# Patient Record
Sex: Male | Born: 1995 | Race: White | Hispanic: No | Marital: Single | Smoking: Current some day smoker
Health system: Southern US, Community
[De-identification: ages and names within clinical notes are randomized; demographics above are authoritative.]

## PROBLEM LIST (undated history)

## (undated) DIAGNOSIS — Z720 Tobacco use: Secondary | ICD-10-CM

## (undated) DIAGNOSIS — F1990 Other psychoactive substance use, unspecified, uncomplicated: Secondary | ICD-10-CM

## (undated) DIAGNOSIS — F909 Attention-deficit hyperactivity disorder, unspecified type: Secondary | ICD-10-CM

## (undated) DIAGNOSIS — F913 Oppositional defiant disorder: Secondary | ICD-10-CM

## (undated) DIAGNOSIS — L0291 Cutaneous abscess, unspecified: Secondary | ICD-10-CM

## (undated) DIAGNOSIS — J45909 Unspecified asthma, uncomplicated: Secondary | ICD-10-CM

## (undated) DIAGNOSIS — F1911 Other psychoactive substance abuse, in remission: Secondary | ICD-10-CM

## (undated) HISTORY — DX: Cutaneous abscess, unspecified: L02.91

## (undated) HISTORY — DX: Other psychoactive substance abuse, in remission: F19.11

## (undated) HISTORY — DX: Unspecified asthma, uncomplicated: J45.909

## (undated) HISTORY — DX: Oppositional defiant disorder: F91.3

## (undated) HISTORY — DX: Tobacco use: Z72.0

## (undated) HISTORY — PX: ABCESS DRAINAGE: SHX399

## (undated) HISTORY — DX: Other psychoactive substance use, unspecified, uncomplicated: F19.90

## (undated) HISTORY — DX: Attention-deficit hyperactivity disorder, unspecified type: F90.9

---
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
## 2015-01-08 LAB — WOUND CULTURE

## 2015-01-18 ENCOUNTER — Ambulatory Visit (INDEPENDENT_AMBULATORY_CARE_PROVIDER_SITE_OTHER): Payer: Medicaid Other | Admitting: Family Medicine

## 2015-01-18 ENCOUNTER — Encounter: Payer: Self-pay | Admitting: Family Medicine

## 2015-01-18 ENCOUNTER — Other Ambulatory Visit: Payer: Self-pay | Admitting: Family Medicine

## 2015-01-18 VITALS — BP 120/74 | HR 78 | Temp 98.8°F | Resp 16 | Wt 143.0 lb

## 2015-01-18 DIAGNOSIS — J208 Acute bronchitis due to other specified organisms: Secondary | ICD-10-CM

## 2015-01-18 MED ORDER — DOXYCYCLINE HYCLATE 100 MG PO CAPS: 100.0000 mg | ORAL_CAPSULE | Freq: Two times a day (BID) | ORAL | Status: DC

## 2015-01-18 NOTE — Progress Notes (Signed)
Subjective:    Patient ID: Frank Duran, male    DOB: 1995/03/01, 19 y.o.   MRN: [id number]  HPI Patient is a 19 year old smoker who presents with 4 days of worsening chest congestion, cough productive of green sputum, wheezing. He has been using albuterol at home as needed for cough and wheezing although his symptoms are getting worse. He denies any hemoptysis. He does report some pleurisy he is no longer receiving Adderall from this office. After his recent admission, he is now seeing a psychiatrist. Past Medical History  Diagnosis Date  . ADHD (attention deficit hyperactivity disorder)   . ODD (oppositional defiant disorder)   . History of substance abuse   . Tobacco abuse 09/16/2012  . IV drug user   . Abscess     Left Elbow ( requiring I & D)   Past Surgical History  Procedure Laterality Date  . Incision and drainage abscess Left 10/18/2014    Procedure: INCISION AND DRAINAGE ABSCESS;  Surgeon: Hubbard Robinson, MD;  Location: [facility] ORS;  Service: General;  Laterality: Left;   Current Outpatient Prescriptions on File Prior to Visit  Medication Sig Dispense Refill  . traZODone (DESYREL) 100 MG tablet Take 1 tablet (100 mg total) by mouth at bedtime and may repeat dose one time if needed. 30 tablet 0   No current facility-administered medications on file prior to visit.   Allergies  Allergen Reactions  . Penicillins Other (See Comments)    Has patient had a PCN reaction causing immediate rash, facial/tongue/throat swelling, SOB or lightheadedness with hypotension: Yes Has patient had a PCN reaction causing severe rash involving mucus membranes or skin necrosis: Yes Has patient had a PCN reaction that required hospitalization Yes- ED visit Has patient had a PCN reaction occurring within the last 10 years: No If all of the above answers are "NO", then may proceed with Cephalosporin use.    Social History   Social History  . Marital Status: Single    Spouse Name:  N/A  . Number of Children: N/A  . Years of Education: N/A   Occupational History  . Not on file.   Social History Main Topics  . Smoking status: Current Some Day Smoker -- 0.50 packs/day for 5 years    Types: Cigarettes  . Smokeless tobacco: Current User  . Alcohol Use: 0.0 oz/week    0 Standard drinks or equivalent per week     Comment: have in the past  . Drug Use: Yes    Special: Marijuana, Hydrocodone, Benzodiazepines     Comment: herion  . Sexual Activity: Not on file     Comment: Lives with Dad, Returning to high school to graduate.  Works on cars.   Other Topics Concern  . Not on file   Social History Narrative      Review of Systems  All other systems reviewed and are negative.      Objective:   Physical Exam  HENT:  Right Ear: External ear normal.  Left Ear: External ear normal.  Nose: Nose normal.  Mouth/Throat: Oropharynx is clear and moist. No oropharyngeal exudate.  Eyes: Conjunctivae are normal.  Neck: Neck supple.  Cardiovascular: Normal rate, regular rhythm and normal heart sounds.   Pulmonary/Chest: Effort normal. He has wheezes. He has rales.  Abdominal: Soft. Bowel sounds are normal.  Lymphadenopathy:    He has no cervical adenopathy.  Vitals reviewed.         Assessment & Plan:  Acute bronchitis  due to other specified organisms - Plan: doxycycline (VIBRAMYCIN) 100 MG capsule  Patient has an asthmatic bronchitis. I recommended albuterol 2 puffs inhaled every 6 hours as needed. Recommended smoking cessation. Use doxycycline 100 mg by mouth twice a day for 10 days. Consider prednisone if symptoms worsen.

## 2015-02-03 ENCOUNTER — Other Ambulatory Visit: Payer: Self-pay | Admitting: Family Medicine

## 2015-06-08 ENCOUNTER — Other Ambulatory Visit: Payer: Self-pay | Admitting: Family Medicine

## 2015-06-08 NOTE — Telephone Encounter (Signed)
Refill appropriate and filled per protocol. 

## 2015-07-06 ENCOUNTER — Other Ambulatory Visit: Payer: Self-pay | Admitting: Family Medicine

## 2015-07-26 ENCOUNTER — Other Ambulatory Visit: Payer: Self-pay | Admitting: Family Medicine

## 2015-08-23 ENCOUNTER — Encounter (HOSPITAL_COMMUNITY): Payer: Self-pay | Admitting: Emergency Medicine

## 2015-08-23 ENCOUNTER — Encounter (HOSPITAL_COMMUNITY): Payer: Self-pay | Admitting: *Deleted

## 2015-08-23 ENCOUNTER — Emergency Department (HOSPITAL_COMMUNITY)
Admission: EM | Admit: 2015-08-23 | Discharge: 2015-08-23 | Disposition: A | Discharge status: Home / Self Care | Payer: Medicaid Other | Attending: Dermatology | Admitting: Dermatology

## 2015-08-23 ENCOUNTER — Emergency Department
Admission: EM | Admit: 2015-08-23 | Discharge: 2015-08-23 | Disposition: A | Discharge status: Home / Self Care | Payer: Medicaid Other | Attending: Emergency Medicine | Admitting: Emergency Medicine

## 2015-08-23 DIAGNOSIS — R52 Pain, unspecified: Secondary | ICD-10-CM | POA: Diagnosis not present

## 2015-08-23 DIAGNOSIS — F1123 Opioid dependence with withdrawal: Secondary | ICD-10-CM | POA: Insufficient documentation

## 2015-08-23 DIAGNOSIS — R45851 Suicidal ideations: Secondary | ICD-10-CM | POA: Insufficient documentation

## 2015-08-23 DIAGNOSIS — Z5321 Procedure and treatment not carried out due to patient leaving prior to being seen by health care provider: Secondary | ICD-10-CM | POA: Insufficient documentation

## 2015-08-23 DIAGNOSIS — F909 Attention-deficit hyperactivity disorder, unspecified type: Secondary | ICD-10-CM | POA: Insufficient documentation

## 2015-08-23 DIAGNOSIS — F17210 Nicotine dependence, cigarettes, uncomplicated: Secondary | ICD-10-CM | POA: Insufficient documentation

## 2015-08-23 DIAGNOSIS — F1120 Opioid dependence, uncomplicated: Secondary | ICD-10-CM | POA: Diagnosis present

## 2015-08-23 LAB — CBC
HCT: 44.5 % (ref 39.0–52.0)
HEMATOCRIT: 41.4 % (ref 39.0–52.0)
HEMOGLOBIN: 14.3 g/dL (ref 13.0–17.0)
Hemoglobin: 15.5 g/dL (ref 13.0–17.0)
MCH: 30.4 pg (ref 26.0–34.0)
MCH: 30.1 pg (ref 26.0–34.0)
MCHC: 34.8 g/dL (ref 30.0–36.0)
MCHC: 34.5 g/dL (ref 30.0–36.0)
MCV: 87.3 fL (ref 78.0–100.0)
MCV: 87.2 fL (ref 78.0–100.0)
PLATELETS: 282 10*3/uL (ref 150–400)
Platelets: 283 10*3/uL (ref 150–400)
RBC: 5.1 MIL/uL (ref 4.22–5.81)
RBC: 4.75 MIL/uL (ref 4.22–5.81)
RDW: 12.3 % (ref 11.5–15.5)
RDW: 12.6 % (ref 11.5–15.5)
WBC: 5 10*3/uL (ref 4.0–10.5)
WBC: 7.3 10*3/uL (ref 4.0–10.5)

## 2015-08-23 LAB — COMPREHENSIVE METABOLIC PANEL
ALBUMIN: 4.4 g/dL (ref 3.5–5.0)
ALT: 21 U/L (ref 17–63)
ALT: 20 U/L (ref 17–63)
ANION GAP: 8 (ref 5–15)
ANION GAP: 7 (ref 5–15)
AST: 25 U/L (ref 15–41)
AST: 20 U/L (ref 15–41)
Albumin: 4.3 g/dL (ref 3.5–5.0)
Alkaline Phosphatase: 91 U/L (ref 38–126)
Alkaline Phosphatase: 83 U/L (ref 38–126)
BILIRUBIN TOTAL: 1.1 mg/dL (ref 0.3–1.2)
BILIRUBIN TOTAL: 0.4 mg/dL (ref 0.3–1.2)
BUN: 9 mg/dL (ref 6–20)
BUN: 10 mg/dL (ref 6–20)
CO2: 28 mmol/L (ref 22–32)
CO2: 29 mmol/L (ref 22–32)
Calcium: 9.8 mg/dL (ref 8.9–10.3)
Calcium: 9.5 mg/dL (ref 8.9–10.3)
Chloride: 101 mmol/L (ref 101–111)
Chloride: 103 mmol/L (ref 101–111)
Creatinine, Ser: 0.98 mg/dL (ref 0.61–1.24)
Creatinine, Ser: 0.88 mg/dL (ref 0.61–1.24)
GFR calc Af Amer: >60 mL/min (ref >60)
GFR calc Af Amer: >60 mL/min (ref >60)
GFR calc non Af Amer: >60 mL/min (ref >60)
GLUCOSE: 93 mg/dL (ref 65–99)
Glucose, Bld: 127 mg/dL — ABNORMAL HIGH (ref 65–99)
POTASSIUM: 4.2 mmol/L (ref 3.5–5.1)
POTASSIUM: 3.9 mmol/L (ref 3.5–5.1)
Sodium: 137 mmol/L (ref 135–145)
Sodium: 139 mmol/L (ref 135–145)
TOTAL PROTEIN: 7.5 g/dL (ref 6.5–8.1)
TOTAL PROTEIN: 7.2 g/dL (ref 6.5–8.1)

## 2015-08-23 LAB — LIPASE, BLOOD: Lipase: 15 U/L (ref 11–51)

## 2015-08-23 LAB — ETHANOL

## 2015-08-23 LAB — SALICYLATE LEVEL: Salicylate Lvl: <4 mg/dL (ref 2.8–30.0)

## 2015-08-23 LAB — ACETAMINOPHEN LEVEL
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
## 2015-08-23 NOTE — ED Triage Notes (Signed)
Pt reports suicidal as well

## 2015-08-23 NOTE — ED Notes (Signed)
Pt here with girlfriend who is also pt and sts tired of waiting and wants to leave; pt ambulated out of department and EDP aware

## 2015-08-24 ENCOUNTER — Emergency Department (HOSPITAL_COMMUNITY)
Admission: EM | Admit: 2015-08-24 | Discharge: 2015-08-24 | Disposition: A | Discharge status: Home / Self Care | Payer: Medicaid Other | Attending: Emergency Medicine | Admitting: Emergency Medicine

## 2015-08-24 ENCOUNTER — Ambulatory Visit (HOSPITAL_COMMUNITY): Admission: RE | Admit: 2015-08-24 | Payer: Medicaid Other | Source: Home / Self Care | Admitting: Psychiatry

## 2015-08-24 DIAGNOSIS — F1123 Opioid dependence with withdrawal: Secondary | ICD-10-CM

## 2015-08-24 DIAGNOSIS — F1193 Opioid use, unspecified with withdrawal: Secondary | ICD-10-CM

## 2015-08-24 MED ORDER — ONDANSETRON HCL 4 MG PO TABS: 4.0000 mg | ORAL_TABLET | Freq: Four times a day (QID) | ORAL | 0 refills | Status: DC

## 2015-08-24 NOTE — ED Notes (Signed)
Pt called,no answer.
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
## 2015-08-24 NOTE — ED Notes (Signed)
PA at bedside.

## 2015-08-24 NOTE — ED Notes (Signed)
Pt called from lobby, no answer. 

## 2015-09-27 ENCOUNTER — Other Ambulatory Visit: Payer: Self-pay | Admitting: Family Medicine

## 2015-10-11 ENCOUNTER — Ambulatory Visit: Payer: Self-pay | Admitting: Family Medicine

## 2015-10-27 ENCOUNTER — Other Ambulatory Visit: Payer: Self-pay | Admitting: Family Medicine

## 2015-11-27 ENCOUNTER — Emergency Department (HOSPITAL_COMMUNITY)
Admission: EM | Admit: 2015-11-27 | Discharge: 2015-11-27 | Discharge status: Against Medical Advice | Payer: Medicaid Other | Attending: Emergency Medicine | Admitting: Emergency Medicine

## 2015-11-27 ENCOUNTER — Emergency Department (HOSPITAL_COMMUNITY): Payer: Medicaid Other

## 2015-11-27 ENCOUNTER — Encounter (HOSPITAL_COMMUNITY): Payer: Self-pay | Admitting: *Deleted

## 2015-11-27 DIAGNOSIS — Y999 Unspecified external cause status: Secondary | ICD-10-CM | POA: Insufficient documentation

## 2015-11-27 DIAGNOSIS — S01511A Laceration without foreign body of lip, initial encounter: Secondary | ICD-10-CM | POA: Diagnosis not present

## 2015-11-27 DIAGNOSIS — M25571 Pain in right ankle and joints of right foot: Secondary | ICD-10-CM | POA: Diagnosis not present

## 2015-11-27 DIAGNOSIS — F17210 Nicotine dependence, cigarettes, uncomplicated: Secondary | ICD-10-CM | POA: Diagnosis not present

## 2015-11-27 DIAGNOSIS — S0993XA Unspecified injury of face, initial encounter: Secondary | ICD-10-CM | POA: Diagnosis present

## 2015-11-27 DIAGNOSIS — Y939 Activity, unspecified: Secondary | ICD-10-CM | POA: Insufficient documentation

## 2015-11-27 DIAGNOSIS — M545 Low back pain, unspecified: Secondary | ICD-10-CM

## 2015-11-27 DIAGNOSIS — F909 Attention-deficit hyperactivity disorder, unspecified type: Secondary | ICD-10-CM | POA: Insufficient documentation

## 2015-11-27 DIAGNOSIS — R51 Headache: Secondary | ICD-10-CM | POA: Diagnosis not present

## 2015-11-27 DIAGNOSIS — Y92410 Unspecified street and highway as the place of occurrence of the external cause: Secondary | ICD-10-CM | POA: Diagnosis not present

## 2015-11-27 MED ORDER — LIDOCAINE-EPINEPHRINE 1 %-1:100000 IJ SOLN
20.0000 mL | Freq: Once | INTRAMUSCULAR | Status: DC
  Filled 2015-11-27: qty 1

## 2015-11-27 MED ORDER — LIDOCAINE-EPINEPHRINE (PF) 1 %-1:200000 IJ SOLN
20.0000 mL | Freq: Once | INTRAMUSCULAR | Status: DC
  Filled 2015-11-27: qty 20

## 2015-11-27 NOTE — ED Notes (Signed)
Patient refused all vitals and being put on monitor.

## 2015-11-27 NOTE — ED Notes (Signed)
Pt. Refused to sign his AMA signature. Pt. Also refused vitals , BP and temp and Pulse ox.  Pt. Jumped out of bed and dressed and left.  Security showed him to the exit.

## 2015-11-27 NOTE — ED Notes (Signed)
Pt. Is very drowsy,  He wakes up to answer questions and goes back to sleep.
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
## 2015-11-27 NOTE — ED Notes (Signed)
Patient transported to CT 

## 2015-11-29 ENCOUNTER — Emergency Department
Admission: EM | Admit: 2015-11-29 | Discharge: 2015-11-29 | Disposition: A | Discharge status: Home / Self Care | Payer: Medicaid Other | Attending: Emergency Medicine | Admitting: Emergency Medicine

## 2015-11-29 DIAGNOSIS — S01511A Laceration without foreign body of lip, initial encounter: Secondary | ICD-10-CM

## 2015-11-29 DIAGNOSIS — M25512 Pain in left shoulder: Secondary | ICD-10-CM | POA: Diagnosis not present

## 2015-11-29 DIAGNOSIS — M25562 Pain in left knee: Secondary | ICD-10-CM | POA: Insufficient documentation

## 2015-11-29 DIAGNOSIS — M25552 Pain in left hip: Secondary | ICD-10-CM | POA: Insufficient documentation

## 2015-11-29 DIAGNOSIS — M79602 Pain in left arm: Secondary | ICD-10-CM | POA: Diagnosis not present

## 2015-11-29 DIAGNOSIS — M25551 Pain in right hip: Secondary | ICD-10-CM | POA: Diagnosis not present

## 2015-11-29 DIAGNOSIS — M79601 Pain in right arm: Secondary | ICD-10-CM | POA: Diagnosis not present

## 2015-11-29 DIAGNOSIS — M25561 Pain in right knee: Secondary | ICD-10-CM | POA: Insufficient documentation

## 2015-11-29 DIAGNOSIS — M542 Cervicalgia: Secondary | ICD-10-CM | POA: Insufficient documentation

## 2015-11-29 DIAGNOSIS — F909 Attention-deficit hyperactivity disorder, unspecified type: Secondary | ICD-10-CM | POA: Diagnosis not present

## 2015-11-29 DIAGNOSIS — F17210 Nicotine dependence, cigarettes, uncomplicated: Secondary | ICD-10-CM | POA: Diagnosis not present

## 2015-11-29 DIAGNOSIS — M545 Low back pain: Secondary | ICD-10-CM | POA: Insufficient documentation

## 2015-11-29 DIAGNOSIS — S0993XA Unspecified injury of face, initial encounter: Secondary | ICD-10-CM | POA: Diagnosis present

## 2015-11-29 DIAGNOSIS — M25572 Pain in left ankle and joints of left foot: Secondary | ICD-10-CM | POA: Insufficient documentation

## 2015-11-29 DIAGNOSIS — Y92410 Unspecified street and highway as the place of occurrence of the external cause: Secondary | ICD-10-CM | POA: Insufficient documentation

## 2015-11-29 DIAGNOSIS — Y939 Activity, unspecified: Secondary | ICD-10-CM | POA: Insufficient documentation

## 2015-11-29 DIAGNOSIS — Z7289 Other problems related to lifestyle: Secondary | ICD-10-CM | POA: Insufficient documentation

## 2015-11-29 DIAGNOSIS — M25571 Pain in right ankle and joints of right foot: Secondary | ICD-10-CM | POA: Insufficient documentation

## 2015-11-29 DIAGNOSIS — M25511 Pain in right shoulder: Secondary | ICD-10-CM | POA: Insufficient documentation

## 2015-11-29 DIAGNOSIS — Y999 Unspecified external cause status: Secondary | ICD-10-CM | POA: Insufficient documentation

## 2015-11-29 DIAGNOSIS — R52 Pain, unspecified: Secondary | ICD-10-CM

## 2015-11-29 DIAGNOSIS — Z765 Malingerer [conscious simulation]: Secondary | ICD-10-CM
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
## 2015-12-15 ENCOUNTER — Other Ambulatory Visit: Payer: Self-pay | Admitting: Family Medicine

## 2016-02-01 ENCOUNTER — Other Ambulatory Visit: Payer: Self-pay | Admitting: Family Medicine

## 2016-02-09 ENCOUNTER — Other Ambulatory Visit: Payer: Self-pay | Admitting: Family Medicine

## 2016-05-02 ENCOUNTER — Other Ambulatory Visit: Payer: Self-pay | Admitting: Family Medicine

## 2016-07-05 ENCOUNTER — Other Ambulatory Visit: Payer: Self-pay | Admitting: Family Medicine

## 2016-07-07 ENCOUNTER — Emergency Department (HOSPITAL_COMMUNITY): Payer: Medicaid Other

## 2016-07-07 ENCOUNTER — Encounter (HOSPITAL_COMMUNITY): Payer: Self-pay | Admitting: Emergency Medicine

## 2016-07-07 ENCOUNTER — Emergency Department (HOSPITAL_COMMUNITY)
Admission: EM | Admit: 2016-07-07 | Discharge: 2016-07-07 | Disposition: A | Discharge status: Home / Self Care | Payer: Medicaid Other | Attending: Emergency Medicine | Admitting: Emergency Medicine

## 2016-07-07 DIAGNOSIS — Y9389 Activity, other specified: Secondary | ICD-10-CM | POA: Diagnosis not present

## 2016-07-07 DIAGNOSIS — S0990XA Unspecified injury of head, initial encounter: Secondary | ICD-10-CM | POA: Insufficient documentation

## 2016-07-07 DIAGNOSIS — W2211XA Striking against or struck by driver side automobile airbag, initial encounter: Secondary | ICD-10-CM | POA: Insufficient documentation

## 2016-07-07 DIAGNOSIS — Y999 Unspecified external cause status: Secondary | ICD-10-CM | POA: Diagnosis not present

## 2016-07-07 DIAGNOSIS — S8002XA Contusion of left knee, initial encounter: Secondary | ICD-10-CM | POA: Diagnosis not present

## 2016-07-07 DIAGNOSIS — S3992XA Unspecified injury of lower back, initial encounter: Secondary | ICD-10-CM | POA: Diagnosis not present

## 2016-07-07 DIAGNOSIS — F17210 Nicotine dependence, cigarettes, uncomplicated: Secondary | ICD-10-CM | POA: Diagnosis not present

## 2016-07-07 DIAGNOSIS — Y92410 Unspecified street and highway as the place of occurrence of the external cause: Secondary | ICD-10-CM | POA: Diagnosis not present

## 2016-07-07 DIAGNOSIS — S199XXA Unspecified injury of neck, initial encounter: Secondary | ICD-10-CM | POA: Diagnosis present

## 2016-07-07 DIAGNOSIS — S299XXA Unspecified injury of thorax, initial encounter: Secondary | ICD-10-CM | POA: Diagnosis not present

## 2016-07-07 DIAGNOSIS — M542 Cervicalgia: Secondary | ICD-10-CM

## 2016-07-07 LAB — COMPREHENSIVE METABOLIC PANEL
ALBUMIN: 3.8 g/dL (ref 3.5–5.0)
ALT: 15 U/L — ABNORMAL LOW (ref 17–63)
AST: 22 U/L (ref 15–41)
Alkaline Phosphatase: 95 U/L (ref 38–126)
Anion gap: 10 (ref 5–15)
BUN: 8 mg/dL (ref 6–20)
CHLORIDE: 102 mmol/L (ref 101–111)
CO2: 24 mmol/L (ref 22–32)
CREATININE: 0.92 mg/dL (ref 0.61–1.24)
Calcium: 9.1 mg/dL (ref 8.9–10.3)
GFR calc Af Amer: >60 mL/min (ref >60)
GFR calc non Af Amer: >60 mL/min (ref >60)
GLUCOSE: 98 mg/dL (ref 65–99)
POTASSIUM: 3.6 mmol/L (ref 3.5–5.1)
SODIUM: 136 mmol/L (ref 135–145)
Total Bilirubin: 1 mg/dL (ref 0.3–1.2)
Total Protein: 6.7 g/dL (ref 6.5–8.1)

## 2016-07-07 LAB — CBC
HCT: 40.2 % (ref 39.0–52.0)
Hemoglobin: 14 g/dL (ref 13.0–17.0)
MCH: 30.1 pg (ref 26.0–34.0)
MCHC: 34.8 g/dL (ref 30.0–36.0)
MCV: 86.5 fL (ref 78.0–100.0)
PLATELETS: 225 10*3/uL (ref 150–400)
RBC: 4.65 MIL/uL (ref 4.22–5.81)
RDW: 12.5 % (ref 11.5–15.5)
WBC: 9.4 10*3/uL (ref 4.0–10.5)

## 2016-07-07 MED ORDER — SODIUM CHLORIDE 0.9 % IV BOLUS (SEPSIS)
1000.0000 mL | Freq: Once | INTRAVENOUS | Status: AC
  Administered 2016-07-07: 1000 mL via INTRAVENOUS

## 2016-07-07 MED ORDER — IOPAMIDOL (ISOVUE-300) INJECTION 61%
INTRAVENOUS | Status: AC
Start: 2016-07-07 — End: 2016-07-07
  Administered 2016-07-07: 100 mL
  Filled 2016-07-07: qty 100

## 2016-07-07 MED ORDER — KETOROLAC TROMETHAMINE 30 MG/ML IJ SOLN
30.0000 mg | Freq: Once | INTRAMUSCULAR | Status: AC
  Administered 2016-07-07: 30 mg via INTRAVENOUS
  Filled 2016-07-07: qty 1
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
## 2016-07-07 NOTE — Discharge Instructions (Signed)
Use ibuprofen 800 mg every six hours for three days. Drink plenty of fluids Walk and perform gentle movements regularly.
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
## 2016-08-24 ENCOUNTER — Other Ambulatory Visit: Payer: Self-pay | Admitting: Family Medicine

## 2016-08-25 ENCOUNTER — Other Ambulatory Visit: Payer: Self-pay | Admitting: Family Medicine

## 2016-08-28 ENCOUNTER — Other Ambulatory Visit: Payer: Self-pay | Admitting: Family Medicine

## 2016-10-23 ENCOUNTER — Encounter: Payer: Self-pay | Admitting: Family Medicine

## 2016-10-23 ENCOUNTER — Ambulatory Visit (INDEPENDENT_AMBULATORY_CARE_PROVIDER_SITE_OTHER): Payer: Medicaid Other | Admitting: Family Medicine

## 2016-10-23 VITALS — BP 90/60 | HR 66 | Temp 98.7°F | Resp 12 | Wt 126.0 lb

## 2016-10-23 DIAGNOSIS — R0781 Pleurodynia: Secondary | ICD-10-CM

## 2016-10-23 NOTE — Progress Notes (Signed)
Subjective:    Patient ID: Frank Duran, male    DOB: 26-Feb-1995, 21 y.o.   MRN: [id number]  HPI  Patient is now been seen since 2016. In the interval time, he has suffered from polysubstance abuse. Today he presents with pain in the anterior portion of his chest. He reports swelling and erythema over his lower left ribs near the xiphoid process. However today there is no visible swelling. There is no erythema. There is no warmth. He is tender to palpation when I touch the rib margin just adjacent to the xiphoid process. He states that it has been hurting ever since he coughed extremely hard when he became accidentally choked on serial. He denies any trauma to the area. On exam today, his lungs are clear to auscultation bilaterally. His heart demonstrates regular rate and rhythm with no murmurs rubs or gallops. Past Medical History:  Diagnosis Date  . Abscess    Left Elbow ( requiring I & D)  . ADHD (attention deficit hyperactivity disorder)   . History of substance abuse   . IV drug user   . ODD (oppositional defiant disorder)   . Tobacco abuse 09/16/2012   Past Surgical History:  Procedure Laterality Date  . INCISION AND DRAINAGE ABSCESS Left 10/18/2014   Procedure: INCISION AND DRAINAGE ABSCESS;  Surgeon: Hubbard Robinson, MD;  Location: [facility] ORS;  Service: General;  Laterality: Left;   Current Outpatient Prescriptions on File Prior to Visit  Medication Sig Dispense Refill  . cetirizine (ZYRTEC) 10 MG tablet TAKE 1 TABLET BY MOUTH ONCE A DAY 30 tablet 3  . ibuprofen (ADVIL,MOTRIN) 800 MG tablet TAKE 1 TABLET BY MOUTH EVERY 8 HOURS AS NEEDED FOR PAIN 90 tablet 0  . PROAIR HFA 108 (90 BASE) MCG/ACT inhaler USE 2 INHALATIONS EVERY 4 HOURS AS NEEDED (Patient taking differently: USE 2 INHALATIONS EVERY 4 HOURS AS NEEDED FOR SHORTNESS OF BREATH/WHEEZING) 8.5 g 1  . traZODone (DESYREL) 100 MG tablet Take 1 tablet (100 mg total) by mouth at bedtime and may repeat dose one time if  needed. 30 tablet 0   No current facility-administered medications on file prior to visit.    Allergies  Allergen Reactions  . Penicillins Other (See Comments)    Has patient had a PCN reaction causing immediate rash, facial/tongue/throat swelling, SOB or lightheadedness with hypotension: Yes Has patient had a PCN reaction causing severe rash involving mucus membranes or skin necrosis: Yes Has patient had a PCN reaction that required hospitalization Yes- ED visit Has patient had a PCN reaction occurring within the last 10 years: No If all of the above answers are "NO", then may proceed with Cephalosporin use.    Social History   Social History  . Marital status: Single    Spouse name: N/A  . Number of children: N/A  . Years of education: N/A   Occupational History  . Not on file.   Social History Main Topics  . Smoking status: Current Some Day Smoker    Packs/day: 0.50    Years: 5.00    Types: Cigarettes  . Smokeless tobacco: Current User  . Alcohol use 0.0 oz/week     Comment: have in the past  . Drug use: Yes    Types: Marijuana, Hydrocodone, Benzodiazepines     Comment: herion  . Sexual activity: Not on file     Comment: Lives with Dad, Returning to high school to graduate.  Works on cars.   Other Topics Concern  .  Not on file   Social History Narrative  . No narrative on file     Review of Systems  All other systems reviewed and are negative.      Objective:   Physical Exam  Cardiovascular: Normal rate and normal heart sounds.  Exam reveals no gallop and no friction rub.   No murmur heard. Pulmonary/Chest: Effort normal and breath sounds normal. No respiratory distress. He has no wheezes. He has no rales. He exhibits bony tenderness. He exhibits no tenderness.    Vitals reviewed.         Assessment & Plan:  Rib pain - Plan: DG Chest 2 View  Given the fact that the swelling has subsided after 1 week and today his exam is completely normal, I  anticipate that the patient may have bruised his ribs. I will begin by obtaining a basic chest x-ray. There is no evidence of a pneumothorax. Cardiovascular exam is completely normal. There is no subcutaneous mass such as an abscess or cyst that I can appreciate. Therefore chest x-ray was normal and in fact symptoms are improving, I will recommend clinical monitoring only.

## 2016-10-26 ENCOUNTER — Emergency Department: Payer: Medicaid Other

## 2016-10-26 ENCOUNTER — Emergency Department
Admission: EM | Admit: 2016-10-26 | Discharge: 2016-10-26 | Disposition: A | Discharge status: Home / Self Care | Payer: Medicaid Other | Attending: Emergency Medicine | Admitting: Emergency Medicine

## 2016-10-26 ENCOUNTER — Encounter: Payer: Self-pay | Admitting: Physician Assistant

## 2016-10-26 DIAGNOSIS — R079 Chest pain, unspecified: Secondary | ICD-10-CM | POA: Diagnosis present

## 2016-10-26 DIAGNOSIS — Z79899 Other long term (current) drug therapy: Secondary | ICD-10-CM | POA: Insufficient documentation

## 2016-10-26 DIAGNOSIS — M940 Chondrocostal junction syndrome [Tietze]: Secondary | ICD-10-CM | POA: Insufficient documentation

## 2016-10-26 DIAGNOSIS — R0789 Other chest pain: Secondary | ICD-10-CM | POA: Insufficient documentation

## 2016-10-26 DIAGNOSIS — F17228 Nicotine dependence, chewing tobacco, with other nicotine-induced disorders: Secondary | ICD-10-CM | POA: Insufficient documentation

## 2016-10-26 DIAGNOSIS — F17210 Nicotine dependence, cigarettes, uncomplicated: Secondary | ICD-10-CM | POA: Insufficient documentation

## 2016-10-26 MED ORDER — CYCLOBENZAPRINE HCL 10 MG PO TABS
5.0000 mg | ORAL_TABLET | Freq: Once | ORAL | Status: AC
Start: 2016-10-26 — End: 2016-10-26
  Administered 2016-10-26: 5 mg via ORAL
  Filled 2016-10-26: qty 1

## 2016-10-26 MED ORDER — CYCLOBENZAPRINE HCL 5 MG PO TABS: 5.0000 mg | ORAL_TABLET | Freq: Three times a day (TID) | ORAL | 0 refills | Status: DC | PRN

## 2016-10-26 MED ORDER — NAPROXEN 500 MG PO TABS: 500.0000 mg | ORAL_TABLET | Freq: Two times a day (BID) | ORAL | 0 refills | Status: AC

## 2016-10-26 MED ORDER — IBUPROFEN 600 MG PO TABS
600.0000 mg | ORAL_TABLET | Freq: Once | ORAL | Status: AC
Start: 2016-10-26 — End: 2016-10-26
  Administered 2016-10-26: 600 mg via ORAL
  Filled 2016-10-26: qty 1

## 2016-10-26 NOTE — Discharge Instructions (Signed)
Your exam and chest x-ray are essentially normal at this time. There is no serious infection seen on your x-ray. Take the prescription meds as directed. Apply ice or moist heat to the area to reduce swelling.

## 2016-10-26 NOTE — ED Provider Notes (Signed)
[facility] Emergency Department Provider Note ____________________________________________  Time seen: 2221  I have reviewed the triage vital signs and the nursing notes.  HISTORY  Chief Complaint  Chest Pain  HPI Frank Duran is a 21 y.o. male Presents to the ED for evaluation of a "knot" to the left chest wall. Patient describes is been there for several weeks. He reports it as being tender to touch is over the lower left rib cage border. He was evaluated by his primary care provider, and told to report to the ED for chest x-ray to rule out a rib fracture.the patient denies any known injury, accident, or chest wall trauma. He denies any shortness of breath or chest wall pain, but does note increased pain with deep breaths, to the lateral rib cage. He reports the area just initial touch, and notes over the last several weeks he's had episodes of swelling and increased tenderness to the area. Denies any fevers, chills, respiratory infections, or other exposures.  Past Medical History:  Diagnosis Date  . Abscess    Left Elbow ( requiring I & D)  . ADHD (attention deficit hyperactivity disorder)   . History of substance abuse   . IV drug user   . ODD (oppositional defiant disorder)   . Tobacco abuse 09/16/2012    Patient Active Problem List   Diagnosis Date Noted  . Cellulitis and abscess 10/18/2014  . Cellulitis of left upper extremity   . Abscess 10/16/2014  . Polysubstance dependence including opioid drug with daily use ([place]) 10/13/2014  . Tobacco abuse 09/16/2012  . ODD (oppositional defiant disorder)   . History of substance abuse   . Back pain 08/06/2012  . ADHD (attention deficit hyperactivity disorder)     Past Surgical History:  Procedure Laterality Date  . INCISION AND DRAINAGE ABSCESS Left 10/18/2014   Procedure: INCISION AND DRAINAGE ABSCESS;  Surgeon: Hubbard Robinson, MD;  Location: [facility] ORS;  Service: General;  Laterality: Left;     Prior to Admission medications   Medication Sig Start Date End Date Taking? Authorizing Provider  cetirizine (ZYRTEC) 10 MG tablet TAKE 1 TABLET BY MOUTH ONCE A DAY 08/25/16   Susy Frizzle, MD  cyclobenzaprine (FLEXERIL) 5 MG tablet Take 1 tablet (5 mg total) by mouth 3 (three) times daily as needed for muscle spasms. 10/26/16   Amiley Shishido, Dannielle Karvonen, PA-C  ibuprofen (ADVIL,MOTRIN) 800 MG tablet TAKE 1 TABLET BY MOUTH EVERY 8 HOURS AS NEEDED FOR PAIN 08/28/16   Susy Frizzle, MD  naproxen (NAPROSYN) 500 MG tablet Take 1 tablet (500 mg total) by mouth 2 (two) times daily with a meal. 10/26/16 11/25/16  Karina Lenderman, Dannielle Karvonen, PA-C  PROAIR HFA 108 (90 BASE) MCG/ACT inhaler USE 2 INHALATIONS EVERY 4 HOURS AS NEEDED Patient taking differently: USE 2 INHALATIONS EVERY 4 HOURS AS NEEDED FOR SHORTNESS OF BREATH/WHEEZING 01/18/15   Susy Frizzle, MD  traZODone (DESYREL) 100 MG tablet Take 1 tablet (100 mg total) by mouth at bedtime and may repeat dose one time if needed. 10/15/14   Kerrie Buffalo, NP    Allergies Penicillins  Family History  Problem Relation Age of Onset  . COPD Mother   . Hyperlipidemia Father   . Cancer Paternal Grandmother        Breast  . Heart disease Paternal Grandmother   . Hypertension Paternal Grandfather   . Heart disease Paternal Grandfather   . Cancer Paternal Grandfather  Prostate    Social History Social History  Substance Use Topics  . Smoking status: Current Some Day Smoker    Packs/day: 0.50    Years: 5.00    Types: Cigarettes  . Smokeless tobacco: Current User  . Alcohol use 0.0 oz/week     Comment: have in the past    Review of Systems  Constitutional: Negative for fever. Eyes: Negative for visual changes. ENT: Negative for sore throat. Cardiovascular: Negative for chest pain. Respiratory: Negative for shortness of breath. Gastrointestinal: Negative for abdominal pain, vomiting and diarrhea. Genitourinary: Negative for  dysuria. Musculoskeletal: Negative for back pain. Left chest wall pain Skin: Negative for rash. Neurological: Negative for headaches, focal weakness or numbness. ____________________________________________  PHYSICAL EXAM:  VITAL SIGNS: ED Triage Vitals  Enc Vitals Group     BP 10/26/16 2117 110/64     Pulse Rate 10/26/16 2117 79     Resp 10/26/16 2117 12     Temp 10/26/16 2117 98.2 F (36.8 C)     Temp Source 10/26/16 2117 Oral     SpO2 10/26/16 2117 99 %     Weight 10/26/16 2122 126 lb (57.2 kg)     Height 10/26/16 2122 5\' 7"  (1.702 m)     Head Circumference --      Peak Flow --      Pain Score 10/26/16 2123 4     Pain Loc --      Pain Edu? --      Excl. in [place]? --     Constitutional: Alert and oriented. Well appearing and in no distress. Patient is sleeping in the treatment bed with a his girlfriend, upon entering the room.  Head: Normocephalic and atraumatic. Cardiovascular: Normal rate, regular rhythm. Normal distal pulses. Respiratory: Normal respiratory effort. No wheezes/rales/rhonchi. Gastrointestinal: Soft and nontender. No distention. Musculoskeletal: patient's chest wall without any obvious deformity or acute injury. He is tender to palpation over the left anterior inferior rib border. There is no crepitus with palpation, flail chest, or other acute chest deformity. Pectus excavatum is appreciated the distal sternum.Nontender with normal range of motion in all extremities.  Neurologic:  Normal gait without ataxia. Normal speech and language. No gross focal neurologic deficits are appreciated. Skin:  Skin is warm, dry and intact. No rash noted. ____________________________________________   RADIOLOGY CXR  IMPRESSION: No active cardiopulmonary disease.  I, Ritik Stavola, Dannielle Karvonen, personally viewed and evaluated these images (plain radiographs) as part of my medical decision making, as well as reviewing the written report by the  radiologist. ____________________________________________  PROCEDURES  Flexeril 5 mg PO Naproxen 500 mg PO ____________________________________________  INITIAL IMPRESSION / ASSESSMENT AND PLAN / ED COURSE  Patient with the ED evaluation of palpable left chest wall pain and tenderness without recent or known injury, accident, or trauma. His exam is overall benign and chest x-ray is negative for any acute fracture, dislocation, or cardiac pulmonary process. His symptoms likely represent a mild chest contusion or early costochondritis. He is discharged with a prescription for cyclobenzaprine and naproxen to dose as directed. He is advised to apply ice to the area to help reduce symptoms. He should follow-up with primary care provider for ongoing symptom management. Return precautions are reviewed. ____________________________________________  FINAL CLINICAL IMPRESSION(S) / ED DIAGNOSES  Final diagnoses:  Chest wall pain  Costochondritis      Carmie End, Dannielle Karvonen, PA-C 10/26/16 2333    Harvest Dark, MD 10/29/16 [phone]

## 2016-10-26 NOTE — ED Triage Notes (Addendum)
Patient ambulatory to triage with steady gait, without difficulty or distress noted; pt reports "knot" to chest for several weeks, tender to touch to left lower ribcage; st was told by PCP to come to ED for CXR to r/o rib fx

## 2016-10-26 NOTE — ED Notes (Signed)

## 2016-11-23 ENCOUNTER — Other Ambulatory Visit: Payer: Self-pay | Admitting: Family Medicine

## 2016-11-27 ENCOUNTER — Ambulatory Visit: Payer: Medicaid Other | Admitting: Family Medicine

## 2016-11-28 ENCOUNTER — Ambulatory Visit: Payer: Medicaid Other | Admitting: Family Medicine

## 2016-12-26 ENCOUNTER — Other Ambulatory Visit: Payer: Self-pay | Admitting: Family Medicine

## 2017-04-05 ENCOUNTER — Ambulatory Visit: Payer: Medicaid Other | Admitting: Family Medicine

## 2017-05-16 ENCOUNTER — Other Ambulatory Visit: Payer: Self-pay | Admitting: Family Medicine

## 2017-06-13 ENCOUNTER — Other Ambulatory Visit: Payer: Self-pay | Admitting: Family Medicine

## 2017-07-06 ENCOUNTER — Other Ambulatory Visit: Payer: Self-pay | Admitting: Family Medicine

## 2017-07-24 ENCOUNTER — Ambulatory Visit (INDEPENDENT_AMBULATORY_CARE_PROVIDER_SITE_OTHER): Payer: Medicaid Other | Admitting: Family Medicine

## 2017-07-24 ENCOUNTER — Encounter: Payer: Self-pay | Admitting: Family Medicine

## 2017-07-24 VITALS — BP 144/88 | HR 110 | Temp 98.3°F | Resp 14 | Ht 68.0 in | Wt 152.0 lb

## 2017-07-24 DIAGNOSIS — Z87898 Personal history of other specified conditions: Secondary | ICD-10-CM

## 2017-07-24 DIAGNOSIS — Z720 Tobacco use: Secondary | ICD-10-CM | POA: Diagnosis not present

## 2017-07-24 DIAGNOSIS — F909 Attention-deficit hyperactivity disorder, unspecified type: Secondary | ICD-10-CM | POA: Diagnosis not present

## 2017-07-24 DIAGNOSIS — J302 Other seasonal allergic rhinitis: Secondary | ICD-10-CM

## 2017-07-24 DIAGNOSIS — M549 Dorsalgia, unspecified: Secondary | ICD-10-CM | POA: Diagnosis not present

## 2017-07-24 DIAGNOSIS — G4700 Insomnia, unspecified: Secondary | ICD-10-CM | POA: Diagnosis not present

## 2017-07-24 DIAGNOSIS — F1911 Other psychoactive substance abuse, in remission: Secondary | ICD-10-CM

## 2017-07-24 DIAGNOSIS — J45909 Unspecified asthma, uncomplicated: Secondary | ICD-10-CM | POA: Diagnosis not present

## 2017-07-24 DIAGNOSIS — G8929 Other chronic pain: Secondary | ICD-10-CM

## 2017-07-24 DIAGNOSIS — Z0000 Encounter for general adult medical examination without abnormal findings: Secondary | ICD-10-CM

## 2017-07-24 DIAGNOSIS — L989 Disorder of the skin and subcutaneous tissue, unspecified: Secondary | ICD-10-CM

## 2017-07-24 LAB — COMPLETE METABOLIC PANEL WITHOUT GFR
AG Ratio: 1.6 (calc) (ref 1.0–2.5)
ALT: 12 U/L (ref 9–46)
AST: 17 U/L (ref 10–40)
Albumin: 4.5 g/dL (ref 3.6–5.1)
Alkaline phosphatase (APISO): 92 U/L (ref 40–115)
BUN: 10 mg/dL (ref 7–25)
CO2: 28 mmol/L (ref 20–32)
Calcium: 10 mg/dL (ref 8.6–10.3)
Chloride: 104 mmol/L (ref 98–110)
Creat: 0.83 mg/dL (ref 0.60–1.35)
GFR, Est African American: 145 mL/min/{1.73_m2}
GFR, Est Non African American: 125 mL/min/{1.73_m2}
Globulin: 2.8 g/dL (ref 1.9–3.7)
Glucose, Bld: 91 mg/dL (ref 65–99)
Potassium: 4.3 mmol/L (ref 3.5–5.3)
Sodium: 141 mmol/L (ref 135–146)
Total Bilirubin: 0.6 mg/dL (ref 0.2–1.2)
Total Protein: 7.3 g/dL (ref 6.1–8.1)

## 2017-07-24 LAB — CBC WITH DIFFERENTIAL/PLATELET
BASOS ABS: 18 {cells}/uL (ref 0–200)
Basophils Relative: 0.3 %
EOS ABS: 43 {cells}/uL (ref 15–500)
Eosinophils Relative: 0.7 %
HEMATOCRIT: 43.1 % (ref 38.5–50.0)
Hemoglobin: 14.8 g/dL (ref 13.2–17.1)
LYMPHS ABS: 1751 {cells}/uL (ref 850–3900)
MCH: 31.1 pg (ref 27.0–33.0)
MCHC: 34.3 g/dL (ref 32.0–36.0)
MCV: 90.5 fL (ref 80.0–100.0)
MPV: 10.6 fL (ref 7.5–12.5)
Monocytes Relative: 7.1 %
NEUTROS PCT: 63.2 %
Neutro Abs: 3855 cells/uL (ref 1500–7800)
Platelets: 292 10*3/uL (ref 140–400)
RBC: 4.76 10*6/uL (ref 4.20–5.80)
RDW: 13.1 % (ref 11.0–15.0)
Total Lymphocyte: 28.7 %
WBC: 6.1 10*3/uL (ref 3.8–10.8)
WBCMIX: 433 {cells}/uL (ref 200–950)

## 2017-07-24 LAB — URINALYSIS, ROUTINE W REFLEX MICROSCOPIC
Bilirubin Urine: NEGATIVE
Glucose, UA: NEGATIVE
HGB URINE DIPSTICK: NEGATIVE
Ketones, ur: NEGATIVE
LEUKOCYTES UA: NEGATIVE
NITRITE: NEGATIVE
Protein, ur: NEGATIVE
Specific Gravity, Urine: 1.025 (ref 1.001–1.03)
pH: 6 (ref 5.0–8.0)
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
## 2017-07-24 MED ORDER — TRAZODONE HCL 50 MG PO TABS: 50.0000 mg | ORAL_TABLET | Freq: Every evening | ORAL | 3 refills | Status: DC | PRN

## 2017-07-24 MED ORDER — ALBUTEROL SULFATE HFA 108 (90 BASE) MCG/ACT IN AERS: INHALATION_SPRAY | RESPIRATORY_TRACT | 1 refills | Status: DC

## 2017-07-24 MED ORDER — CETIRIZINE HCL 10 MG PO TABS: 10.0000 mg | ORAL_TABLET | Freq: Every day | ORAL | 11 refills | Status: DC

## 2017-07-24 NOTE — Patient Instructions (Addendum)
Get ahold of outpatient psychiatry - get into the outpatient intensive program - or at least outpatient management  See the asthma hand out - if you are using your inhaler several times a week we may need to increase your controller medications.  Use the trazadone at night and come follow up in 2-4 weeks if you are not getting into a good pattern of sleep.  See sleep hygiene handout -  Get busy - get another job, Psychologist, occupational.  Engage yourself in activities and physical exercise to help you be healthy, physically and mentally, and it should help your sleep.    Insomnia Insomnia is a sleep disorder that makes it difficult to fall asleep or to stay asleep. Insomnia can cause tiredness (fatigue), low energy, difficulty concentrating, mood swings, and poor performance at work or school. There are three different ways to classify insomnia:  Difficulty falling asleep.  Difficulty staying asleep.  Waking up too early in the morning.  Any type of insomnia can be long-term (chronic) or short-term (acute). Both are common. Short-term insomnia usually lasts for three months or less. Chronic insomnia occurs at least three times a week for longer than three months. What are the causes? Insomnia may be caused by another condition, situation, or substance, such as:  Anxiety.  Certain medicines.  Gastroesophageal reflux disease (GERD) or other gastrointestinal conditions.  Asthma or other breathing conditions.  Restless legs syndrome, sleep apnea, or other sleep disorders.  Chronic pain.  Menopause. This may include hot flashes.  Stroke.  Abuse of alcohol, tobacco, or illegal drugs.  Depression.  Caffeine.  Neurological disorders, such as Alzheimer disease.  An overactive thyroid (hyperthyroidism).  The cause of insomnia may not be known. What increases the risk? Risk factors for insomnia include:  Gender. Women are more commonly affected than men.  Age. Insomnia is more common  as you get older.  Stress. This may involve your professional or personal life.  Income. Insomnia is more common in people with lower income.  Lack of exercise.  Irregular work schedule or night shifts.  Traveling between different time zones.  What are the signs or symptoms? If you have insomnia, trouble falling asleep or trouble staying asleep is the main symptom. This may lead to other symptoms, such as:  Feeling fatigued.  Feeling nervous about going to sleep.  Not feeling rested in the morning.  Having trouble concentrating.  Feeling irritable, anxious, or depressed.  How is this treated? Treatment for insomnia depends on the cause. If your insomnia is caused by an underlying condition, treatment will focus on addressing the condition. Treatment may also include:  Medicines to help you sleep.  Counseling or therapy.  Lifestyle adjustments.  Follow these instructions at home:  Take medicines only as directed by your health care provider.  Keep regular sleeping and waking hours. Avoid naps.  Keep a sleep diary to help you and your health care provider figure out what could be causing your insomnia. Include: ? When you sleep. ? When you wake up during the night. ? How well you sleep. ? How rested you feel the next day. ? Any side effects of medicines you are taking. ? What you eat and drink.  Make your bedroom a comfortable place where it is easy to fall asleep: ? Put up shades or special blackout curtains to block light from outside. ? Use a white noise machine to block noise. ? Keep the temperature cool.  Exercise regularly as directed by your health  care provider. Avoid exercising right before bedtime.  Use relaxation techniques to manage stress. Ask your health care provider to suggest some techniques that may work well for you. These may include: ? Breathing exercises. ? Routines to release muscle tension. ? Visualizing peaceful scenes.  Cut back on  alcohol, caffeinated beverages, and cigarettes, especially close to bedtime. These can disrupt your sleep.  Do not overeat or eat spicy foods right before bedtime. This can lead to digestive discomfort that can make it hard for you to sleep.  Limit screen use before bedtime. This includes: ? Watching TV. ? Using your smartphone, tablet, and computer.  Stick to a routine. This can help you fall asleep faster. Try to do a quiet activity, brush your teeth, and go to bed at the same time each night.  Get out of bed if you are still awake after 15 minutes of trying to sleep. Keep the lights down, but try reading or doing a quiet activity. When you feel sleepy, go back to bed.  Make sure that you drive carefully. Avoid driving if you feel very sleepy.  Keep all follow-up appointments as directed by your health care provider. This is important. Contact a health care provider if:  You are tired throughout the day or have trouble in your daily routine due to sleepiness.  You continue to have sleep problems or your sleep problems get worse. Get help right away if:  You have serious thoughts about hurting yourself or someone else. This information is not intended to replace advice given to you by your health care provider. Make sure you discuss any questions you have with your health care provider. Document Released: 01/14/2000 Document Revised: 06/18/2015 Document Reviewed: 10/17/2013 Elsevier Interactive Patient Education  2018 [place] [employer] in [employer]  Intensive Outpatient Programs: [facility]      [place]. [place], [place] Both a day and evening program       [facility] Outpatient     [address]        [place], [place] [address] [phone]         ADS: [facility] [place]: [phone] or [phone] [address], [place] [address] [email or web address]   Substance Abuse Resources: - [facility]  [phone] - [facility] [phone] - The [place] [phone] - Residential & Outpatient Substance Abuse Program  [phone]  Psychological Services: - [place]  [place], [phone] [place]. [address] [place], [place] LINE: [phone] or [phone], [email or web address]  Mobile Crisis Teams:                                        [facility]         Mobile Crisis Care Unit [phone]             [facility] [address] Lady Gary [place] LYYTKP 546  [address] [place] [phone]  Self-Help/Support Groups: [facility] [employer] of support groups [phone] (call for more info)  Narcotics Anonymous (NA) Caring Services [address] [place] - 2 meetings at this location  Residential Treatment Programs:  [place]       [place] [address] [place], [place]  [address] [place]  [address] [place], [place] [address] [phone] Admissions: 8am-3pm M-F  Incentives Substance [place]     [address], [place] [address]       [phone]         The [place] [address] Jadene Pierini [place], [place]  [facility] [address] [place], [place]  [facility] - Intensive Outpatient      [address]     [place],  [place]         [facility] ([place]     [place], [place] or [phone]  Residential Treatment Services (RTS), Medicaid [place], [place]  Fellowship [address]                                               [address] [place]  [facility] Resources: [phone]               General Therapy                                                Domenic Schwab, PhD        [address] [place], [place] [address]         [facility]   [address] [place], [place] [address] [phone]  [facility] [address] [place], [place] [address] [phone] Insurance/Medicaid/sponsorship through [facility] and [facility]                                              [address] [place]                                        [place], [place] [address]    Therapy/tele-psych/case         [place] [address], [place]  [address]  Adolescent/group home/case management [phone]  Rosette Reveal PhD       General therapy       Insurance   [phone]         Dr. Adele Schilder, Insurance, M-F [phone]  [facility]. [address]         [place] Phone:  [phone]                                  Phone:  [phone]                   Phone:  [phone]  [facility], [place] in [place], [address]             [phone],  Insurance  Asthma Attack Prevention, Adult Although you may not be able to control the fact that you have asthma, you can take actions to prevent episodes of asthma (asthma attacks). These actions include:  Creating a written plan for managing and treating your asthma attacks (asthma action plan).  Monitoring your asthma.  Avoiding things that can irritate your airways or make your asthma symptoms worse (asthma triggers).  Taking your medicines as directed.  Acting quickly if you have signs or symptoms of an asthma attack.  What are some ways to prevent an asthma attack? Create a plan Work with your health care provider to create an asthma action plan. This plan should include:  A list of your asthma triggers and how to avoid them.  A list of symptoms that you experience during an asthma attack.  Information about when to take medicine and how much medicine to take.  Information to help you understand your peak flow measurements.  Contact information for your health care providers.  Daily actions that you can take to control asthma.  Monitor your asthma  To monitor your asthma:  Use your peak flow meter every morning and every evening for 2-3 weeks. Record the results in a journal. A drop in your peak flow numbers on one or more days may mean that you are starting to have an asthma attack, even if you are not having symptoms.  When you have asthma symptoms, write them down in a journal.  Avoid asthma triggers  Work with your health care provider to find out what your asthma triggers are. This can be done by:  Being tested for allergies.  Keeping a journal that notes when asthma attacks occur and what may have contributed to them.  Asking your health care provider whether other medical conditions make your asthma worse.  Common asthma triggers include:  Dust.  Smoke. This includes campfire smoke and  secondhand smoke from tobacco products.  Pet dander.  Trees,  grasses or pollens.  Very cold, dry, or humid air.  Mold.  Foods that contain high amounts of sulfites.  Strong smells.  Engine exhaust and air pollution.  Aerosol sprays and fumes from household cleaners.  Household pests and their droppings, including dust mites and cockroaches.  Certain medicines, including NSAIDs.  Once you have determined your asthma triggers, take steps to avoid them. Depending on your triggers, you may be able to reduce the chance of an asthma attack by:  Keeping your home clean. Have someone dust and vacuum your home for you 1 or 2 times a week. If possible, have them use a high-efficiency particulate arrestance (HEPA) vacuum.  Washing your sheets weekly in hot water.  Using allergy-proof mattress covers and casings on your bed.  Keeping pets out of your home.  Taking care of mold and water problems in your home.  Avoiding areas where people smoke.  Avoiding using strong perfumes or odor sprays.  Avoid spending a lot of time outdoors when pollen counts are high and on very windy days.  Talking with your health care provider before stopping or starting any new medicines.  Medicines Take over-the-counter and prescription medicines only as told by your health care provider. Many asthma attacks can be prevented by carefully following your medicine schedule. Taking your medicines correctly is especially important when you cannot avoid certain asthma triggers. Even if you are doing well, do not stop taking your medicine and do not take less medicine. Act quickly If an asthma attack happens, acting quickly can decrease how severe it is and how long it lasts. Take these actions:  Pay attention to your symptoms. If you are coughing, wheezing, or having difficulty breathing, do not wait to see if your symptoms go away on their own. Follow your asthma action plan.  If you have followed your asthma action plan and your symptoms are not improving, call your  health care provider or seek immediate medical care at the nearest hospital.  It is important to write down how often you need to use your fast-acting rescue inhaler. You can track how often you use an inhaler in your journal. If you are using your rescue inhaler more often, it may mean that your asthma is not under control. Adjusting your asthma treatment plan may help you to prevent future asthma attacks and help you to gain better control of your condition. How can I prevent an asthma attack when I exercise?  Exercise is a common asthma trigger. To prevent asthma attacks during exercise:  Follow advice from your health care provider about whether you should use your fast-acting inhaler before exercising. Many people with asthma experience exercise-induced bronchoconstriction (EIB). This condition often worsens during vigorous exercise in cold, humid, or dry environments. Usually, people with EIB can stay very active by using a fast-acting inhaler before exercising.  Avoid exercising outdoors in very cold or humid weather.  Avoid exercising outdoors when pollen counts are high.  Warm up and cool down when exercising.  Stop exercising right away if asthma symptoms start.  Consider taking part in exercises that are less likely to cause asthma symptoms such as:  Indoor swimming.  Biking.  Walking.  Hiking.  Playing football.  This information is not intended to replace advice given to you by your health care provider. Make sure you discuss any questions you have with your health care provider. Document Released: 01/04/2009 Document Revised: 09/17/2015  Document Reviewed: 07/03/2015 Elsevier Interactive Patient Education  [employer].

## 2017-07-25 NOTE — Progress Notes (Signed)
Basic labs look excellent!

## 2017-07-26 ENCOUNTER — Encounter: Payer: Self-pay | Admitting: Family Medicine

## 2017-07-26 DIAGNOSIS — G4700 Insomnia, unspecified: Secondary | ICD-10-CM | POA: Insufficient documentation

## 2017-07-26 DIAGNOSIS — J302 Other seasonal allergic rhinitis: Secondary | ICD-10-CM | POA: Insufficient documentation

## 2017-07-26 DIAGNOSIS — J45909 Unspecified asthma, uncomplicated: Secondary | ICD-10-CM | POA: Insufficient documentation
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
## 2017-08-07 ENCOUNTER — Ambulatory Visit: Payer: Self-pay | Admitting: Family Medicine

## 2017-08-09 ENCOUNTER — Encounter: Payer: Self-pay | Admitting: Family Medicine

## 2017-09-20 ENCOUNTER — Other Ambulatory Visit: Payer: Self-pay | Admitting: Family Medicine

## 2017-11-22 ENCOUNTER — Other Ambulatory Visit: Payer: Self-pay | Admitting: Family Medicine

## 2017-12-19 ENCOUNTER — Ambulatory Visit: Payer: Self-pay | Admitting: Family Medicine

## 2018-01-08 ENCOUNTER — Other Ambulatory Visit: Payer: Self-pay | Admitting: Family Medicine

## 2018-01-08 DIAGNOSIS — G4700 Insomnia, unspecified: Secondary | ICD-10-CM

## 2018-01-09 ENCOUNTER — Encounter: Payer: Self-pay | Admitting: Family Medicine

## 2018-05-23 ENCOUNTER — Other Ambulatory Visit: Payer: Self-pay | Admitting: Family Medicine

## 2018-05-29 ENCOUNTER — Other Ambulatory Visit: Payer: Self-pay

## 2018-05-30 ENCOUNTER — Ambulatory Visit: Payer: Self-pay | Admitting: Family Medicine

## 2018-06-04 ENCOUNTER — Encounter: Payer: Self-pay | Admitting: Family Medicine

## 2018-06-04 ENCOUNTER — Ambulatory Visit (INDEPENDENT_AMBULATORY_CARE_PROVIDER_SITE_OTHER): Payer: Medicaid Other | Admitting: Family Medicine

## 2018-06-04 ENCOUNTER — Other Ambulatory Visit: Payer: Self-pay

## 2018-06-04 VITALS — BP 110/72 | HR 95 | Temp 98.2°F | Ht 68.0 in | Wt 129.8 lb

## 2018-06-04 DIAGNOSIS — R220 Localized swelling, mass and lump, head: Secondary | ICD-10-CM | POA: Diagnosis not present

## 2018-06-04 NOTE — Progress Notes (Signed)
Patient ID: Frank Duran, male    DOB: 1996/01/28, 23 y.o.   MRN: [id number]  PCP: Susy Frizzle, MD  Chief Complaint  Patient presents with  . Mass    R ear, x4 months    Subjective:   Frank Duran is a 23 y.o. male, presents to clinic with CC of bump on face in front of right ear, has been there for 4 months (estimate).  It has not changed or enlarged acutely.  He just wanted to get it checked out.  He denies any other areas similar now or in the past.  He denies any trauma to the area, denies color change, drainage, pain.  No URI sx, ear sx.  No lymphadenopathy.    Patient Active Problem List   Diagnosis Date Noted  . Uncomplicated asthma 83/41/9622  . Insomnia 07/26/2017  . Seasonal allergies 07/26/2017  . Cellulitis and abscess 10/18/2014  . Cellulitis of left upper extremity   . Abscess 10/16/2014  . Polysubstance dependence including opioid drug with daily use ([place]) 10/13/2014  . Tobacco abuse 09/16/2012  . ODD (oppositional defiant disorder)   . History of substance abuse ([place])   . Back pain 08/06/2012  . ADHD (attention deficit hyperactivity disorder)      Prior to Admission medications   Medication Sig Start Date End Date Taking? Authorizing Provider  albuterol [facility] HFA) 108 (90 Base) MCG/ACT inhaler USE 2 INHALATIONS EVERY 4 HOURS AS NEEDED 07/24/17   Delsa Grana, PA-C  cetirizine (ZYRTEC) 10 MG tablet Take 1 tablet (10 mg total) by mouth daily. 07/24/17   Delsa Grana, PA-C  cyclobenzaprine (FLEXERIL) 5 MG tablet Take 1 tablet (5 mg total) by mouth 3 (three) times daily as needed for muscle spasms. 10/26/16   Menshew, Dannielle Karvonen, PA-C  ibuprofen (ADVIL) 800 MG tablet TAKE 1 TABLET BY MOUTH EVERY 8 HOURS AS NEEDED FOR PAIN 05/23/18   Susy Frizzle, MD  traZODone (DESYREL) 50 MG tablet TAKE 1 TO 2 TABLETS BY MOUTH AT BEDTIME AS NEEDED FOR SLEEP 01/08/18   Susy Frizzle, MD     Allergies  Allergen Reactions  . Penicillins  Other (See Comments)    Has patient had a PCN reaction causing immediate rash, facial/tongue/throat swelling, SOB or lightheadedness with hypotension: Yes Has patient had a PCN reaction causing severe rash involving mucus membranes or skin necrosis: Yes Has patient had a PCN reaction that required hospitalization Yes- ED visit Has patient had a PCN reaction occurring within the last 10 years: No If all of the above answers are "NO", then may proceed with Cephalosporin use.      Family History  Problem Relation Age of Onset  . COPD Mother   . Hyperlipidemia Father   . Cancer Paternal Grandmother        Breast  . Heart disease Paternal Grandmother   . Hypertension Paternal Grandfather   . Heart disease Paternal Grandfather   . Cancer Paternal Grandfather        Prostate     Social History   Socioeconomic History  . Marital status: Single    Spouse name: Not on file  . Number of children: Not on file  . Years of education: Not on file  . Highest education level: Not on file  Occupational History  . Not on file  Social Needs  . Financial resource strain: Not on file  . Food insecurity:    Worry: Not on file  Inability: Not on file  . Transportation needs:    Medical: Not on file    Non-medical: Not on file  Tobacco Use  . Smoking status: Current Some Day Smoker    Packs/day: 0.50    Years: 5.00    Pack years: 2.50    Types: Cigarettes  . Smokeless tobacco: Current User  Substance and Sexual Activity  . Alcohol use: Not Currently    Alcohol/week: 0.0 standard drinks    Comment: have in the past  . Drug use: Not Currently    Types: Marijuana  . Sexual activity: Never  Lifestyle  . Physical activity:    Days per week: Not on file    Minutes per session: Not on file  . Stress: Not on file  Relationships  . Social connections:    Talks on phone: Not on file    Gets together: Not on file    Attends religious service: Not on file    Active member of club or  organization: Not on file    Attends meetings of clubs or organizations: Not on file    Relationship status: Not on file  . Intimate partner violence:    Fear of current or ex partner: Not on file    Emotionally abused: Not on file    Physically abused: Not on file    Forced sexual activity: Not on file  Other Topics Concern  . 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   HENT: Negative.   Eyes: Negative.   Respiratory: Negative.   Cardiovascular: Negative.   Gastrointestinal: Negative.   Endocrine: Negative.   Genitourinary: Negative.   Musculoskeletal: Negative.   Skin: Negative.   Allergic/Immunologic: Negative.   Neurological: Negative.   Hematological: Negative.   Psychiatric/Behavioral: Negative.   All other systems reviewed and are negative.      Objective:    There were no vitals filed for this visit.    Physical Exam Vitals signs and nursing note reviewed.  Constitutional:      General: He is not in acute distress.    Appearance: He is well-developed. He is not ill-appearing, toxic-appearing or diaphoretic.     Comments: Well appearing thin young man, NAD  HENT:     Head: Normocephalic and atraumatic.     Right Ear: Hearing, tympanic membrane, ear canal and external ear normal. There is no impacted cerumen.     Left Ear: Hearing, tympanic membrane, ear canal and external ear normal. There is no impacted cerumen.     Ears:      Comments: Red oval - notes area that is very soft, enlarged and fluctuant, roughly 1x1.5 cm, non-tender.  Overlying skin normal in appearance and flesh colored, no edema, no induration    Nose: Nose normal. No congestion or rhinorrhea.     Mouth/Throat:     Mouth: Mucous membranes are moist.     Pharynx: Oropharynx is clear.  Eyes:     General:        Right eye: No discharge.        Left eye: No discharge.     Conjunctiva/sclera: Conjunctivae normal.   Neck:     Trachea: No tracheal deviation.  Cardiovascular:     Rate and Rhythm: Normal rate and regular rhythm.  Pulmonary:     Effort: Pulmonary effort is normal. No respiratory distress.     Breath  sounds: No stridor.  Musculoskeletal: Normal range of motion.  Lymphadenopathy:     Head:     Right side of head: No submental, submandibular, tonsillar, preauricular, posterior auricular or occipital adenopathy.     Left side of head: No submental, submandibular, tonsillar, preauricular or posterior auricular adenopathy.     Cervical: No cervical adenopathy.  Skin:    General: Skin is warm and dry.     Findings: No rash.  Neurological:     Mental Status: He is alert.     Motor: No abnormal muscle tone.     Coordination: Coordination normal.  Psychiatric:        Behavior: Behavior normal.           Assessment & Plan:      ICD-10-CM   1. Subcutaneous mass of head R22.0    not consistent with abscess or lymphadenopathy - suspect a cyst?  fluid filled?   Given location/surrounding anatomy, no acute inflammation or signs of infection requiring urgent I&D, no changes for months, will refer to ENT for further eval.     Delsa Grana, PA-C 06/04/18 2:16 PM

## 2018-06-14 ENCOUNTER — Other Ambulatory Visit: Payer: Self-pay | Admitting: Family Medicine

## 2018-06-14 DIAGNOSIS — G4700 Insomnia, unspecified: Secondary | ICD-10-CM

## 2018-07-22 ENCOUNTER — Encounter: Payer: Self-pay | Admitting: Intensive Care

## 2018-07-22 ENCOUNTER — Emergency Department: Payer: Medicaid Other

## 2018-07-22 ENCOUNTER — Emergency Department
Admission: EM | Admit: 2018-07-22 | Discharge: 2018-07-22 | Disposition: A | Discharge status: Home / Self Care | Payer: Medicaid Other | Attending: Emergency Medicine | Admitting: Emergency Medicine

## 2018-07-22 ENCOUNTER — Other Ambulatory Visit: Payer: Self-pay

## 2018-07-22 DIAGNOSIS — F17228 Nicotine dependence, chewing tobacco, with other nicotine-induced disorders: Secondary | ICD-10-CM | POA: Diagnosis not present

## 2018-07-22 DIAGNOSIS — F1210 Cannabis abuse, uncomplicated: Secondary | ICD-10-CM | POA: Insufficient documentation

## 2018-07-22 DIAGNOSIS — Z041 Encounter for examination and observation following transport accident: Secondary | ICD-10-CM | POA: Insufficient documentation

## 2018-07-22 DIAGNOSIS — Z20828 Contact with and (suspected) exposure to other viral communicable diseases: Secondary | ICD-10-CM | POA: Diagnosis not present

## 2018-07-22 DIAGNOSIS — R1110 Vomiting, unspecified: Secondary | ICD-10-CM | POA: Diagnosis not present

## 2018-07-22 DIAGNOSIS — Y998 Other external cause status: Secondary | ICD-10-CM | POA: Insufficient documentation

## 2018-07-22 DIAGNOSIS — F1410 Cocaine abuse, uncomplicated: Secondary | ICD-10-CM | POA: Diagnosis not present

## 2018-07-22 DIAGNOSIS — F1510 Other stimulant abuse, uncomplicated: Secondary | ICD-10-CM | POA: Diagnosis not present

## 2018-07-22 DIAGNOSIS — Y9389 Activity, other specified: Secondary | ICD-10-CM | POA: Insufficient documentation

## 2018-07-22 DIAGNOSIS — R51 Headache: Secondary | ICD-10-CM | POA: Diagnosis not present

## 2018-07-22 DIAGNOSIS — Y92414 Local residential or business street as the place of occurrence of the external cause: Secondary | ICD-10-CM | POA: Diagnosis not present

## 2018-07-22 DIAGNOSIS — F17210 Nicotine dependence, cigarettes, uncomplicated: Secondary | ICD-10-CM | POA: Diagnosis not present

## 2018-07-22 LAB — CBC
HCT: 43 % (ref 39.0–52.0)
Hemoglobin: 14.6 g/dL (ref 13.0–17.0)
MCH: 29 pg (ref 26.0–34.0)
MCHC: 34 g/dL (ref 30.0–36.0)
MCV: 85.5 fL (ref 80.0–100.0)
Platelets: 267 10*3/uL (ref 150–400)
RBC: 5.03 MIL/uL (ref 4.22–5.81)
RDW: 12.4 % (ref 11.5–15.5)
WBC: 7.9 10*3/uL (ref 4.0–10.5)
nRBC: 0 % (ref 0.0–0.2)

## 2018-07-22 LAB — BASIC METABOLIC PANEL
Anion gap: 11 (ref 5–15)
BUN: 10 mg/dL (ref 6–20)
CO2: 29 mmol/L (ref 22–32)
Calcium: 9.7 mg/dL (ref 8.9–10.3)
Chloride: 98 mmol/L (ref 98–111)
Creatinine, Ser: 0.91 mg/dL (ref 0.61–1.24)
GFR calc Af Amer: >60 mL/min (ref >60)
GFR calc non Af Amer: >60 mL/min (ref >60)
Glucose, Bld: 101 mg/dL — ABNORMAL HIGH (ref 70–99)
Potassium: 4 mmol/L (ref 3.5–5.1)
Sodium: 138 mmol/L (ref 135–145)

## 2018-07-22 LAB — ETHANOL: Alcohol, Ethyl (B): <10 mg/dL (ref <10)

## 2018-07-22 LAB — SARS CORONAVIRUS 2 BY RT PCR (HOSPITAL ORDER, PERFORMED IN ~~LOC~~ HOSPITAL LAB): SARS Coronavirus 2: NEGATIVE

## 2018-07-22 MED ORDER — IBUPROFEN 600 MG PO TABS: 600.0000 mg | ORAL_TABLET | Freq: Four times a day (QID) | ORAL | 0 refills | Status: AC | PRN
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
## 2018-08-13 ENCOUNTER — Other Ambulatory Visit: Payer: Self-pay | Admitting: Family Medicine

## 2018-08-13 DIAGNOSIS — J45909 Unspecified asthma, uncomplicated: Secondary | ICD-10-CM

## 2018-10-30 ENCOUNTER — Encounter: Payer: Self-pay | Admitting: Family Medicine

## 2018-10-30 ENCOUNTER — Other Ambulatory Visit: Payer: Self-pay

## 2018-10-30 ENCOUNTER — Ambulatory Visit (INDEPENDENT_AMBULATORY_CARE_PROVIDER_SITE_OTHER): Payer: Medicaid Other | Admitting: Family Medicine

## 2018-10-30 VITALS — BP 104/62 | HR 92 | Temp 98.2°F | Resp 16 | Ht 68.0 in | Wt 143.0 lb

## 2018-10-30 DIAGNOSIS — R222 Localized swelling, mass and lump, trunk: Secondary | ICD-10-CM

## 2018-10-30 DIAGNOSIS — D2330 Other benign neoplasm of skin of unspecified part of face: Secondary | ICD-10-CM | POA: Diagnosis not present

## 2018-10-30 MED ORDER — METHADONE HCL 10 MG/ML PO CONC: 30.0000 mg | Freq: Three times a day (TID) | ORAL | 0 refills | Status: AC

## 2018-10-30 NOTE — Patient Instructions (Signed)
Referral to surgery  Get the xray done   [place] [facility])  F/U pending results
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
## 2018-11-06 ENCOUNTER — Ambulatory Visit
Admission: RE | Admit: 2018-11-06 | Discharge: 2018-11-06 | Disposition: A | Discharge status: Home / Self Care | Payer: Medicaid Other | Source: Ambulatory Visit | Attending: Family Medicine | Admitting: Family Medicine

## 2018-11-06 DIAGNOSIS — R222 Localized swelling, mass and lump, trunk: Secondary | ICD-10-CM

## 2018-11-07 ENCOUNTER — Telehealth: Payer: Self-pay

## 2018-11-07 DIAGNOSIS — R222 Localized swelling, mass and lump, trunk: Secondary | ICD-10-CM

## 2018-11-07 DIAGNOSIS — R2230 Localized swelling, mass and lump, unspecified upper limb: Secondary | ICD-10-CM

## 2018-11-07 NOTE — Telephone Encounter (Signed)
Ct order placed

## 2018-11-22 ENCOUNTER — Ambulatory Visit (HOSPITAL_COMMUNITY): Admission: RE | Admit: 2018-11-22 | Payer: Medicaid Other | Source: Ambulatory Visit

## 2018-12-12 ENCOUNTER — Other Ambulatory Visit: Payer: Self-pay

## 2018-12-12 ENCOUNTER — Ambulatory Visit (INDEPENDENT_AMBULATORY_CARE_PROVIDER_SITE_OTHER): Payer: Medicaid Other | Admitting: Otolaryngology

## 2018-12-25 ENCOUNTER — Other Ambulatory Visit: Payer: Self-pay | Admitting: Otolaryngology

## 2019-01-02 ENCOUNTER — Encounter (HOSPITAL_BASED_OUTPATIENT_CLINIC_OR_DEPARTMENT_OTHER): Payer: Self-pay | Admitting: *Deleted

## 2019-01-02 ENCOUNTER — Other Ambulatory Visit: Payer: Self-pay

## 2019-01-07 ENCOUNTER — Other Ambulatory Visit (HOSPITAL_COMMUNITY): Payer: No Typology Code available for payment source

## 2019-01-07 ENCOUNTER — Other Ambulatory Visit: Payer: Self-pay

## 2019-01-07 ENCOUNTER — Other Ambulatory Visit (HOSPITAL_COMMUNITY)
Admission: RE | Admit: 2019-01-07 | Discharge: 2019-01-07 | Disposition: A | Discharge status: Home / Self Care | Payer: Medicaid Other | Source: Ambulatory Visit | Attending: Otolaryngology | Admitting: Otolaryngology

## 2019-01-07 DIAGNOSIS — Z20828 Contact with and (suspected) exposure to other viral communicable diseases: Secondary | ICD-10-CM | POA: Insufficient documentation

## 2019-01-07 DIAGNOSIS — Z01812 Encounter for preprocedural laboratory examination: Secondary | ICD-10-CM | POA: Diagnosis not present

## 2019-01-07 LAB — SARS CORONAVIRUS 2 (TAT 6-24 HRS): SARS Coronavirus 2: NEGATIVE

## 2019-01-10 ENCOUNTER — Other Ambulatory Visit: Payer: Self-pay

## 2019-01-10 ENCOUNTER — Ambulatory Visit (HOSPITAL_BASED_OUTPATIENT_CLINIC_OR_DEPARTMENT_OTHER)
Admission: RE | Admit: 2019-01-10 | Discharge: 2019-01-10 | Disposition: A | Discharge status: Home / Self Care | Payer: Medicaid Other | Attending: Otolaryngology | Admitting: Otolaryngology

## 2019-01-10 ENCOUNTER — Ambulatory Visit (HOSPITAL_BASED_OUTPATIENT_CLINIC_OR_DEPARTMENT_OTHER): Payer: Medicaid Other | Admitting: Anesthesiology

## 2019-01-10 ENCOUNTER — Encounter (HOSPITAL_BASED_OUTPATIENT_CLINIC_OR_DEPARTMENT_OTHER): Payer: Self-pay | Admitting: Otolaryngology

## 2019-01-10 ENCOUNTER — Encounter (HOSPITAL_BASED_OUTPATIENT_CLINIC_OR_DEPARTMENT_OTHER)
Admission: RE | Disposition: A | Discharge status: Home / Self Care | Payer: Self-pay | Source: Home / Self Care | Attending: Otolaryngology

## 2019-01-10 DIAGNOSIS — L720 Epidermal cyst: Secondary | ICD-10-CM | POA: Insufficient documentation

## 2019-01-10 DIAGNOSIS — L723 Sebaceous cyst: Secondary | ICD-10-CM

## 2019-01-10 DIAGNOSIS — Z880 Allergy status to penicillin: Secondary | ICD-10-CM | POA: Diagnosis not present

## 2019-01-10 DIAGNOSIS — F17210 Nicotine dependence, cigarettes, uncomplicated: Secondary | ICD-10-CM | POA: Insufficient documentation

## 2019-01-10 DIAGNOSIS — J45909 Unspecified asthma, uncomplicated: Secondary | ICD-10-CM | POA: Insufficient documentation

## 2019-01-10 DIAGNOSIS — Q181 Preauricular sinus and cyst: Secondary | ICD-10-CM | POA: Diagnosis present

## 2019-01-10 HISTORY — PX: MASS EXCISION: SHX2000

## 2019-01-10 SURGERY — EXCISION MASS
Anesthesia: General | Site: Face | Laterality: Right

## 2019-01-10 MED ORDER — HYDROCODONE-ACETAMINOPHEN 7.5-325 MG PO TABS: 1.0000 | ORAL_TABLET | Freq: Once | ORAL | Status: DC | PRN

## 2019-01-10 MED ORDER — 0.9 % SODIUM CHLORIDE (POUR BTL) OPTIME
TOPICAL | Status: DC | PRN
  Administered 2019-01-10: 100 mL

## 2019-01-10 MED ORDER — FENTANYL CITRATE (PF) 100 MCG/2ML IJ SOLN: 25.0000 ug | INTRAMUSCULAR | Status: DC | PRN

## 2019-01-10 MED ORDER — KETOROLAC TROMETHAMINE 30 MG/ML IJ SOLN: 30.0000 mg | Freq: Once | INTRAMUSCULAR | Status: DC | PRN

## 2019-01-10 MED ORDER — LACTATED RINGERS IV SOLN
INTRAVENOUS | Status: DC
  Administered 2019-01-10: 10:00:00 via INTRAVENOUS

## 2019-01-10 MED ORDER — CLINDAMYCIN PHOSPHATE 600 MG/50ML IV SOLN
INTRAVENOUS | Status: DC | PRN
  Administered 2019-01-10: 600 mg via INTRAVENOUS

## 2019-01-10 MED ORDER — LIDOCAINE-EPINEPHRINE 1 %-1:100000 IJ SOLN
INTRAMUSCULAR | Status: DC | PRN
  Administered 2019-01-10: 2 mL

## 2019-01-10 MED ORDER — FENTANYL CITRATE (PF) 100 MCG/2ML IJ SOLN
INTRAMUSCULAR | Status: DC | PRN
  Administered 2019-01-10 (×2): 50 ug via INTRAVENOUS

## 2019-01-10 MED ORDER — ONDANSETRON HCL 4 MG/2ML IJ SOLN: 4.0000 mg | Freq: Once | INTRAMUSCULAR | Status: DC | PRN

## 2019-01-10 MED ORDER — ONDANSETRON HCL 4 MG/2ML IJ SOLN
INTRAMUSCULAR | Status: AC
  Filled 2019-01-10: qty 2

## 2019-01-10 MED ORDER — MIDAZOLAM HCL 2 MG/2ML IJ SOLN
INTRAMUSCULAR | Status: AC
  Filled 2019-01-10: qty 2

## 2019-01-10 MED ORDER — ONDANSETRON HCL 4 MG/2ML IJ SOLN
INTRAMUSCULAR | Status: DC | PRN
  Administered 2019-01-10: 4 mg via INTRAVENOUS

## 2019-01-10 MED ORDER — CLINDAMYCIN HCL 300 MG PO CAPS: 300.0000 mg | ORAL_CAPSULE | Freq: Three times a day (TID) | ORAL | 0 refills | Status: AC

## 2019-01-10 MED ORDER — LIDOCAINE 2% (20 MG/ML) 5 ML SYRINGE
INTRAMUSCULAR | Status: DC | PRN
  Administered 2019-01-10: 100 mg via INTRAVENOUS

## 2019-01-10 MED ORDER — PROPOFOL 10 MG/ML IV BOLUS
INTRAVENOUS | Status: DC | PRN
  Administered 2019-01-10: 180 mg via INTRAVENOUS

## 2019-01-10 MED ORDER — FENTANYL CITRATE (PF) 100 MCG/2ML IJ SOLN
INTRAMUSCULAR | Status: AC
  Filled 2019-01-10: qty 2

## 2019-01-10 MED ORDER — DEXAMETHASONE SODIUM PHOSPHATE 10 MG/ML IJ SOLN
INTRAMUSCULAR | Status: DC | PRN
  Administered 2019-01-10: 10 mg via INTRAVENOUS

## 2019-01-10 MED ORDER — DEXMEDETOMIDINE HCL IN NACL 200 MCG/50ML IV SOLN
INTRAVENOUS | Status: DC | PRN
  Administered 2019-01-10: 28 ug via INTRAVENOUS

## 2019-01-10 MED ORDER — CLINDAMYCIN PHOSPHATE 600 MG/50ML IV SOLN
INTRAVENOUS | Status: AC
  Filled 2019-01-10: qty 50

## 2019-01-10 MED ORDER — DEXAMETHASONE SODIUM PHOSPHATE 10 MG/ML IJ SOLN
INTRAMUSCULAR | Status: AC
  Filled 2019-01-10: qty 1

## 2019-01-10 MED ORDER — LIDOCAINE-EPINEPHRINE 1 %-1:100000 IJ SOLN
INTRAMUSCULAR | Status: AC
  Filled 2019-01-10: qty 1

## 2019-01-10 MED ORDER — MIDAZOLAM HCL 5 MG/5ML IJ SOLN
INTRAMUSCULAR | Status: DC | PRN
  Administered 2019-01-10: 2 mg via INTRAVENOUS

## 2019-01-10 MED ORDER — LIDOCAINE 2% (20 MG/ML) 5 ML SYRINGE
INTRAMUSCULAR | Status: AC
  Filled 2019-01-10: qty 5

## 2019-01-10 SURGICAL SUPPLY — 59 items
ATTRACTOMAT 16X20 MAGNETIC DRP (DRAPES) IMPLANT
BLADE SURG 15 STRL LF DISP TIS (BLADE) IMPLANT
BLADE SURG 15 STRL SS (BLADE)
CANISTER SUCT 1200ML W/VALVE (MISCELLANEOUS) ×2 IMPLANT
CORD BIPOLAR FORCEPS 12FT (ELECTRODE) IMPLANT
COVER BACK TABLE REUSABLE LG (DRAPES) ×2 IMPLANT
COVER MAYO STAND REUSABLE (DRAPES) ×2 IMPLANT
COVER WAND RF STERILE (DRAPES) IMPLANT
DECANTER SPIKE VIAL GLASS SM (MISCELLANEOUS) ×2 IMPLANT
DERMABOND ADVANCED (GAUZE/BANDAGES/DRESSINGS) ×1
DERMABOND ADVANCED .7 DNX12 (GAUZE/BANDAGES/DRESSINGS) ×1 IMPLANT
DRAIN JACKSON RD 7FR 3/32 (WOUND CARE) IMPLANT
DRAIN PENROSE 1/4X12 LTX STRL (WOUND CARE) IMPLANT
DRAIN TLS ROUND 10FR (DRAIN) IMPLANT
DRAPE U-SHAPE 76X120 STRL (DRAPES) ×2 IMPLANT
ELECT COATED BLADE 2.86 ST (ELECTRODE) ×2 IMPLANT
ELECT NEEDLE BLADE 2-5/6 (NEEDLE) IMPLANT
ELECT PAIRED SUBDERMAL (MISCELLANEOUS)
ELECT REM PT RETURN 9FT ADLT (ELECTROSURGICAL) ×2
ELECTRODE PAIRED SUBDERMAL (MISCELLANEOUS) IMPLANT
ELECTRODE REM PT RTRN 9FT ADLT (ELECTROSURGICAL) ×1 IMPLANT
EVACUATOR SILICONE 100CC (DRAIN) IMPLANT
FORCEPS BIPOLAR SPETZLER 8 1.0 (NEUROSURGERY SUPPLIES) IMPLANT
GAUZE 4X4 16PLY RFD (DISPOSABLE) IMPLANT
GAUZE SPONGE 4X4 12PLY STRL LF (GAUZE/BANDAGES/DRESSINGS) IMPLANT
GLOVE BIO SURGEON STRL SZ7.5 (GLOVE) ×2 IMPLANT
GLOVE BIOGEL PI IND STRL 7.0 (GLOVE) ×2 IMPLANT
GLOVE BIOGEL PI INDICATOR 7.0 (GLOVE) ×2
GLOVE SURG SS PI 6.5 STRL IVOR (GLOVE) ×2 IMPLANT
GOWN STRL REUS W/ TWL LRG LVL3 (GOWN DISPOSABLE) ×2 IMPLANT
GOWN STRL REUS W/TWL LRG LVL3 (GOWN DISPOSABLE) ×2
HEMOSTAT SURGICEL 2X14 (HEMOSTASIS) IMPLANT
LOCATOR NERVE 3 VOLT (DISPOSABLE) IMPLANT
NEEDLE HYPO 25X1 1.5 SAFETY (NEEDLE) ×2 IMPLANT
NS IRRIG 1000ML POUR BTL (IV SOLUTION) ×2 IMPLANT
PACK BASIN DAY SURGERY FS (CUSTOM PROCEDURE TRAY) ×2 IMPLANT
PENCIL SMOKE EVACUATOR (MISCELLANEOUS) ×2 IMPLANT
PIN SAFETY STERILE (MISCELLANEOUS) IMPLANT
PROBE NERVBE PRASS .33 (MISCELLANEOUS) IMPLANT
SLEEVE SCD COMPRESS KNEE MED (MISCELLANEOUS) ×2 IMPLANT
SPONGE GAUZE 2X2 8PLY STRL LF (GAUZE/BANDAGES/DRESSINGS) IMPLANT
SUCTION FRAZIER HANDLE 10FR (MISCELLANEOUS) ×1
SUCTION TUBE FRAZIER 10FR DISP (MISCELLANEOUS) ×1 IMPLANT
SUT ETHILON 3 0 PS 1 (SUTURE) IMPLANT
SUT ETHILON 5 0 P 3 18 (SUTURE)
SUT NYLON ETHILON 5-0 P-3 1X18 (SUTURE) IMPLANT
SUT PROLENE 4 0 P 3 18 (SUTURE) IMPLANT
SUT SILK 3 0 TIES 17X18 (SUTURE)
SUT SILK 3-0 18XBRD TIE BLK (SUTURE) IMPLANT
SUT SILK 4 0 TIES 17X18 (SUTURE) IMPLANT
SUT VIC AB 3-0 FS2 27 (SUTURE) IMPLANT
SUT VIC AB 4-0 P-3 18XBRD (SUTURE) IMPLANT
SUT VIC AB 4-0 P3 18 (SUTURE)
SUT VICRYL 4-0 PS2 18IN ABS (SUTURE) ×2 IMPLANT
SYR BULB 3OZ (MISCELLANEOUS) ×2 IMPLANT
SYR CONTROL 10ML LL (SYRINGE) ×2 IMPLANT
TOWEL GREEN STERILE FF (TOWEL DISPOSABLE) ×2 IMPLANT
TUBE CONNECTING 20X1/4 (TUBING) ×2 IMPLANT
YANKAUER SUCT BULB TIP NO VENT (SUCTIONS) IMPLANT

## 2019-01-10 NOTE — Transfer of Care (Signed)
Immediate Anesthesia Transfer of Care Note  Patient: Airline pilot  Procedure(s) Performed: EXCISION OF FACIAL MASS (Right Face)  Patient Location: PACU  Anesthesia Type:General  Level of Consciousness: drowsy and patient cooperative  Airway & Oxygen Therapy: Patient Spontanous Breathing and Patient connected to face mask oxygen  Post-op Assessment: Report given to RN and Post -op Vital signs reviewed and stable  Post vital signs: Reviewed and stable  Last Vitals:  Vitals Value Taken Time  BP 119/74 01/10/19 1235  Temp    Pulse 56 01/10/19 1238  Resp 11 01/10/19 1238  SpO2 100 % 01/10/19 1238  Vitals shown include unvalidated device data.  Last Pain:  Vitals:   01/10/19 0953  TempSrc: Oral  PainSc: 0-No pain      Patients Stated Pain Goal: 3 ([id number])  Complications: No apparent anesthesia complications

## 2019-01-10 NOTE — Anesthesia Postprocedure Evaluation (Signed)
Anesthesia Post Note  Patient: Airline pilot  Procedure(s) Performed: EXCISION OF FACIAL MASS (Right Face)     Patient location during evaluation: PACU Anesthesia Type: General Level of consciousness: awake and alert and oriented Pain management: pain level controlled Vital Signs Assessment: post-procedure vital signs reviewed and stable Respiratory status: spontaneous breathing, nonlabored ventilation and respiratory function stable Cardiovascular status: blood pressure returned to baseline Postop Assessment: no apparent nausea or vomiting Anesthetic complications: no    Last Vitals:  Vitals:   01/10/19 1300 01/10/19 1315  BP: 109/70 127/88  Pulse: 69 62  Resp: 15 16  Temp:    SpO2: 99% 100%    Last Pain:  Vitals:   01/10/19 1315  TempSrc:   PainSc: [place]

## 2019-01-10 NOTE — H&P (Signed)
Cc: Facial cyst  HPI: The patient is a 23 y/o male who presents today for evaluation of a facial cyst. The patient is seen in consultation requested by Dr. Vic Blackbird. The patient first noticed a cyst in front of his right ear 6 months ago. The cyst has not changed in size and has not been painful. The patient also has a cyst on his chest. The patient denies otalgia, otorrhea, or hearing loss. Previous ENT surgery is denied.   The patient's review of systems (constitutional, eyes, ENT, cardiovascular, respiratory, GI, musculoskeletal, skin, neurologic, psychiatric, endocrine, hematologic, allergic) is noted in the ROS questionnaire.  It is reviewed with the patient.   Family health history: Diabetes.  Major events: None.  Ongoing medical problems: None.  Social history: The patient is single. He smokes 0.5 pack of cigarettes a day. He denies the use of alcohol or illegal drugs.  Exam: General: Communicates without difficulty, well nourished, no acute distress. Head: Normocephalic, no evidence injury, no tenderness, facial buttresses intact without stepoff. Eyes: PERRL, EOMI. No scleral icterus, conjunctivae clear. Neuro: CN II exam reveals vision grossly intact.  No nystagmus at any point of gaze. Ears: Auricles well formed without lesions. A 3cm cyst is noted along the right preauricular area.   Ear canals are intact without mass or lesion.  No erythema or edema is appreciated.  The TMs are intact without fluid. Nose: External evaluation reveals normal support and skin without lesions.  Dorsum is intact.  Anterior rhinoscopy reveals healthy pink mucosa over anterior aspect of inferior turbinates and intact septum.  No purulence noted. Oral:  Oral cavity and oropharynx are intact, symmetric, without erythema or edema.  Mucosa is moist without lesions. Neck: Full range of motion without pain.  There is no significant lymphadenopathy.  No masses palpable.  Thyroid bed within normal limits to  palpation.  Parotid glands and submandibular glands equal bilaterally without mass.  Trachea is midline. Neuro:  CN 2-12 grossly intact. Gait normal. Vestibular: No nystagmus at any point of gaze. The cerebellar examination is unremarkable.   Assessment A 3 cm right preauricular cyst is noted. The patient has an otherwise normal ENT evaluation today.   Plan  1. The physical exam findings are reviewed with the patient.  2. Treatment options include conservative observation versus excision. The risks, benefits, alternatives, and details of the procedure are reviewed with the patient. Questions are invited and answered. 3. The patient is interested in proceeding with the procedure.  We will schedule the procedure in accordance with the patient's schedule.

## 2019-01-10 NOTE — Anesthesia Procedure Notes (Signed)
Procedure Name: LMA Insertion Date/Time: 01/10/2019 12:01 PM Performed by: Genelle Bal, CRNA Pre-anesthesia Checklist: Patient identified, Emergency Drugs available, Suction available and Patient being monitored Patient Re-evaluated:Patient Re-evaluated prior to induction Oxygen Delivery Method: Circle system utilized Preoxygenation: Pre-oxygenation with 100% oxygen Induction Type: IV induction Ventilation: Mask ventilation without difficulty LMA: LMA inserted LMA Size: 4.0 Number of attempts: 1 Airway Equipment and Method: Bite block Placement Confirmation: positive ETCO2 Tube secured with: Tape Dental Injury: Teeth and Oropharynx as per pre-operative assessment

## 2019-01-10 NOTE — Op Note (Signed)
DATE OF PROCEDURE: 01/10/2019  OPERATIVE REPORT   SURGEON: Leta Baptist, MD  PREOPERATIVE DIAGNOSIS: Right preauricular cyst.  POSTOPERATIVE DIAGNOSIS: Right preauricular cyst.  PROCEDURES PERFORMED: 1. Excision of right preauricular cyst (3.5cm)  ANESTHESIA: General laryngeal mask anesthesia.  COMPLICATIONS: None.  ESTIMATED BLOOD LOSS: Minimal.  INDICATION FOR PROCEDURE:  Frank Duran is a 23 y.o. male with a history of a 3cm right preauricular cyst. The patient would like to have the cyst removed.  The risks, benefits, alternatives, and details of the procedures were discussed with the patient. Questions were invited and answered. Informed consent was obtained.  DESCRIPTION OF PROCEDURE: The patient was taken to the operating room and placed supine on the operating table. General laryngeal mask anesthesia was induced by the anesthesiologist. The patient was positioned and prepped and draped in the standard fashion for right facial surgery.  1% lidocaine with 1-100,000 epinephrine was infiltrated at the planned site of incision.  A 3.5 cm right preauricular incision was made.  The incision was carried down to the subcutaneous level.  A 3 cm sebaceous cyst was noted.  Careful dissection was performed to free the sebaceous cyst from the surrounding soft tissue.  The entire cyst was sent to the pathology department for permanent histologic identification.  The surgical site was copiously irrigated.  The incision was closed in layers with 4-0 Vicryl and Dermabond.  The care of the patient was turned over to the anesthesiologist. The patient was awakened from anesthesia without difficulty. He was extubated and transferred to the recovery room in good condition.  OPERATIVE FINDINGS: A right preauricular sebaceous cyst.  SPECIMEN:Right preauricular sebaceous cyst.  FOLLOWUP CARE: The patient will be discharged home once he is awake and alert. He will  follow up in my office as needed.

## 2019-01-10 NOTE — Discharge Instructions (Addendum)
The patient may resume all his previous activity and diet.  He may follow-up in my office on an as needed basis.   -----------------   Post Anesthesia Home Care Instructions  Activity: Get plenty of rest for the remainder of the day. A responsible individual must stay with you for 24 hours following the procedure.  For the next 24 hours, DO NOT: -Drive a car -Paediatric nurse -Drink alcoholic beverages -Take any medication unless instructed by your physician -Make any legal decisions or sign important papers.  Meals: Start with liquid foods such as gelatin or soup. Progress to regular foods as tolerated. Avoid greasy, spicy, heavy foods. If nausea and/or vomiting occur, drink only clear liquids until the nausea and/or vomiting subsides. Call your physician if vomiting continues.  Special Instructions/Symptoms: Your throat may feel dry or sore from the anesthesia or the breathing tube placed in your throat during surgery. If this causes discomfort, gargle with warm salt water. The discomfort should disappear within 24 hours.  If you had a scopolamine patch placed behind your ear for the management of post- operative nausea and/or vomiting:  1. The medication in the patch is effective for 72 hours, after which it should be removed.  Wrap patch in a tissue and discard in the trash. Wash hands thoroughly with soap and water. 2. You may remove the patch earlier than 72 hours if you experience unpleasant side effects which may include dry mouth, dizziness or visual disturbances. 3. Avoid touching the patch. Wash your hands with soap and water after contact with the patch.

## 2019-01-10 NOTE — Anesthesia Preprocedure Evaluation (Addendum)
Anesthesia Evaluation  Patient identified by MRN, date of birth, ID band Patient awake    Reviewed: Allergy & Precautions, NPO status , Patient's Chart, lab work & pertinent test results  Airway Mallampati: II  TM Distance: >3 FB Neck ROM: Full    Dental no notable dental hx. (+) Teeth Intact, Dental Advisory Given   Pulmonary asthma , Current SmokerPatient did not abstain from smoking.,    Pulmonary exam normal breath sounds clear to auscultation       Cardiovascular negative cardio ROS Normal cardiovascular exam Rhythm:Regular Rate:Normal     Neuro/Psych    GI/Hepatic (+)     substance abuse  ,   Endo/Other  negative endocrine ROS  Renal/GU negative Renal ROS     Musculoskeletal negative musculoskeletal ROS (+)   Abdominal   Peds  Hematology negative hematology ROS (+)   Anesthesia Other Findings   Reproductive/Obstetrics                            Anesthesia Physical Anesthesia Plan  ASA: II  Anesthesia Plan: General   Post-op Pain Management:    Induction: Intravenous  PONV Risk Score and Plan: 3 and Treatment may vary due to age or medical condition, Ondansetron and Dexamethasone  Airway Management Planned: LMA  Additional Equipment: None  Intra-op Plan:   Post-operative Plan:   Informed Consent: I have reviewed the patients History and Physical, chart, labs and discussed the procedure including the risks, benefits and alternatives for the proposed anesthesia with the patient or authorized representative who has indicated his/her understanding and acceptance.     Dental advisory given  Plan Discussed with:   Anesthesia Plan Comments:         Anesthesia Quick Evaluation

## 2019-01-13 ENCOUNTER — Encounter: Payer: Self-pay | Admitting: *Deleted

## 2019-01-13 LAB — SURGICAL PATHOLOGY

## 2019-06-10 ENCOUNTER — Other Ambulatory Visit: Payer: Self-pay | Admitting: Family Medicine

## 2019-06-10 NOTE — Telephone Encounter (Signed)
Ok to refill 

## 2021-01-09 ENCOUNTER — Emergency Department
Admission: EM | Admit: 2021-01-09 | Discharge: 2021-01-09 | Disposition: A | Discharge status: Home / Self Care | Payer: Medicaid Other | Attending: Emergency Medicine | Admitting: Emergency Medicine

## 2021-01-09 ENCOUNTER — Encounter: Payer: Self-pay | Admitting: Emergency Medicine

## 2021-01-09 ENCOUNTER — Other Ambulatory Visit: Payer: Self-pay

## 2021-01-09 ENCOUNTER — Emergency Department: Payer: Medicaid Other

## 2021-01-09 DIAGNOSIS — J45909 Unspecified asthma, uncomplicated: Secondary | ICD-10-CM | POA: Insufficient documentation

## 2021-01-09 DIAGNOSIS — F17210 Nicotine dependence, cigarettes, uncomplicated: Secondary | ICD-10-CM | POA: Diagnosis not present

## 2021-01-09 DIAGNOSIS — S7002XA Contusion of left hip, initial encounter: Secondary | ICD-10-CM | POA: Insufficient documentation

## 2021-01-09 DIAGNOSIS — W108XXA Fall (on) (from) other stairs and steps, initial encounter: Secondary | ICD-10-CM | POA: Diagnosis not present

## 2021-01-09 DIAGNOSIS — T148XXA Other injury of unspecified body region, initial encounter: Secondary | ICD-10-CM

## 2021-01-09 DIAGNOSIS — S79912A Unspecified injury of left hip, initial encounter: Secondary | ICD-10-CM | POA: Diagnosis present

## 2021-01-09 DIAGNOSIS — W19XXXA Unspecified fall, initial encounter: Secondary | ICD-10-CM

## 2021-01-09 MED ORDER — KETOROLAC TROMETHAMINE 30 MG/ML IJ SOLN
30.0000 mg | Freq: Once | INTRAMUSCULAR | Status: AC
  Administered 2021-01-09: 30 mg via INTRAMUSCULAR
  Filled 2021-01-09: qty 1

## 2021-01-09 NOTE — ED Triage Notes (Signed)
Pt reports a week ago he fell on some stairs and landed on his left hip. Pt reports has been resting it but continues to have pain

## 2021-01-09 NOTE — ED Provider Notes (Signed)
[facility] Emergency Department Provider Note   ____________________________________________   Event Date/Time   First MD Initiated Contact with Patient 01/09/21 1332     (approximate)  I have reviewed the triage vital signs and the nursing notes.   HISTORY  Chief Complaint Fall and Hip Pain    HPI Frank Duran is a 25 y.o. male with past medical history of asthma and polysubstance abuse who presents to the ED complaining of hip pain.  Patient reports that he fell down some concrete steps about 1 week ago onto his left hip and left leg.  He denies hitting his head or losing consciousness, does not take any blood thinners.  He does state that he has been dealing with significant pain and swelling over the lateral portion of his left hip.  Pain is described as sharp and exacerbated when he goes to bear weight on his left leg.  He denies any pain in the distal portion of his left lower extremity and has not had any pain in his right lower extremity or upper extremities.  He states that pain is primarily alleviated with ice but he has not been taking any medication for pain.        Past Medical History:  Diagnosis Date   Abscess    Left Elbow ( requiring I & D)   ADHD (attention deficit hyperactivity disorder)    Asthma    History of substance abuse ([place])    IV drug user    ODD (oppositional defiant disorder)    Tobacco abuse 09/16/2012    Patient Active Problem List   Diagnosis Date Noted   Uncomplicated asthma 90/30/0923   Insomnia 07/26/2017   Seasonal allergies 07/26/2017   Cellulitis and abscess 10/18/2014   Cellulitis of left upper extremity    Abscess 10/16/2014   Polysubstance dependence including opioid drug with daily use ([place]) 10/13/2014   Tobacco abuse 09/16/2012   ODD (oppositional defiant disorder)    History of substance abuse ([place])    Back pain 08/06/2012   ADHD (attention deficit hyperactivity disorder)     Past  Surgical History:  Procedure Laterality Date   INCISION AND DRAINAGE ABSCESS Left 10/18/2014   Procedure: INCISION AND DRAINAGE ABSCESS;  Surgeon: Hubbard Robinson, MD;  Location: [facility] ORS;  Service: General;  Laterality: Left;   MASS EXCISION Right 01/10/2019   Procedure: EXCISION OF FACIAL MASS;  Surgeon: Leta Baptist, MD;  Location: [place];  Service: ENT;  Laterality: Right;    Prior to Admission medications   Medication Sig Start Date End Date Taking? Authorizing Provider  acetaminophen (TYLENOL) 325 MG tablet Take 650 mg by mouth every 6 (six) hours as needed.    [provider]  albuterol (VENTOLIN HFA) 108 (90 Base) MCG/ACT inhaler USE 2 INHALATIONS EVERY 4 HOURS AS NEEDED 08/13/18   Susy Frizzle, MD  ibuprofen (ADVIL) 600 MG tablet Take 1 tablet (600 mg total) by mouth every 6 (six) hours as needed. 07/22/18   Laban Emperor, PA-C  ibuprofen (ADVIL) 800 MG tablet TAKE 1 TABLET BY MOUTH EVERY 8 HOURS AS NEEDED FOR PAIN 06/12/19   Susy Frizzle, MD  methadone (DOLOPHINE) 10 MG/ML solution Take 3 mLs (30 mg total) by mouth every 8 (eight) hours. Per pain clinic 10/30/18   Alycia Rossetti, MD  traZODone (DESYREL) 50 MG tablet TAKE 1 TO 2 TABLETS BY MOUTH AT BEDTIME AS NEEDED FOR SLEEP 06/14/18   Jenna Luo  T, MD    Allergies Penicillins  Family History  Problem Relation Age of Onset   COPD Mother    Hyperlipidemia Father    Cancer Paternal Grandmother        Breast   Heart disease Paternal Grandmother    Hypertension Paternal Grandfather    Heart disease Paternal Grandfather    Cancer Paternal Grandfather        Prostate    Social History Social History   Tobacco Use   Smoking status: Some Days    Packs/day: 0.50    Years: 5.00    Pack years: 2.50    Types: Cigarettes   Smokeless tobacco: Current  Vaping Use   Vaping Use: Never used  Substance Use Topics   Alcohol use: Not Currently    Alcohol/week: 0.0 standard drinks     Comment: have in the past   Drug use: Yes    Types: Marijuana    Review of Systems  Constitutional: No fever/chills Eyes: No visual changes. ENT: No sore throat. Cardiovascular: Denies chest pain. Respiratory: Denies shortness of breath. Gastrointestinal: No abdominal pain.  No nausea, no vomiting.  No diarrhea.  No constipation. Genitourinary: Negative for dysuria. Musculoskeletal: Negative for back pain.  Positive for left hip pain and swelling. Skin: Negative for rash. Neurological: Negative for headaches, focal weakness or numbness.  ____________________________________________   PHYSICAL EXAM:  VITAL SIGNS: ED Triage Vitals  Enc Vitals Group     BP 01/09/21 1020 (!) 142/91     Pulse Rate 01/09/21 1020 (!) 117     Resp 01/09/21 1020 18     Temp 01/09/21 1020 97.8 F (36.6 C)     Temp src --      SpO2 01/09/21 1020 100 %     Weight 01/09/21 1015 160 lb (72.6 kg)     Height 01/09/21 1015 5\' 7"  (1.702 m)     Head Circumference --      Peak Flow --      Pain Score 01/09/21 1014 8     Pain Loc --      Pain Edu? --      Excl. in [place]? --     Constitutional: Alert and oriented. Eyes: Conjunctivae are normal. Head: Atraumatic. Nose: No congestion/rhinnorhea. Mouth/Throat: Mucous membranes are moist. Neck: Normal ROM Cardiovascular: Normal rate, regular rhythm. Grossly normal heart sounds.  2+ DP pulses bilaterally. Respiratory: Normal respiratory effort.  No retractions. Lungs CTAB. Gastrointestinal: Soft and nontender. No distention. Genitourinary: deferred Musculoskeletal: Edema and ecchymosis noted to left lateral hip with diffuse tenderness, range of motion to left hip intact with some pain.  No tenderness to palpation at right hip, bilateral knees, or bilateral ankles.  Patient ambulatory with minimal difficulty. Neurologic:  Normal speech and language. No gross focal neurologic deficits are appreciated. Skin:  Skin is warm, dry and intact. No rash  noted. Psychiatric: Mood and affect are normal. Speech and behavior are normal.  ____________________________________________   LABS (all labs ordered are listed, but only abnormal results are displayed)  Labs Reviewed - No data to display   PROCEDURES  Procedure(s) performed (including Critical Care):  Procedures   ____________________________________________   INITIAL IMPRESSION / ASSESSMENT AND PLAN / ED COURSE      25 year old male with past medical history of asthma and polysubstance use who presents to the ED complaining of left hip pain and swelling after a fall down the steps about 1 week ago.  No evidence of trauma to his  head, neck, or trunk.  X-rays obtained of left hip and reviewed by me with no evidence of fracture or dislocation.  No evidence of traumatic injury to his right lower extremity, bilateral upper extremities, or distal left lower extremity.  He does appear to have significant hematoma to his left hip, pain improving with application of ice and IM Toradol.  He is appropriate for outpatient management, was counseled to continue applying ice to the area and to take Tylenol and ibuprofen as needed.  He was counseled to follow-up with his PCP and to return to the ED for new worsening symptoms, patient agrees with plan.      ____________________________________________   FINAL CLINICAL IMPRESSION(S) / ED DIAGNOSES  Final diagnoses:  Fall, initial encounter  Contusion of left hip, initial encounter  Hematoma     ED Discharge Orders     None        Note:  This document was prepared using Dragon voice recognition software and may include unintentional dictation errors.    Blake Divine, MD 01/09/21 [phone]

## 2021-05-31 NOTE — Therapy (Incomplete)
?OUTPATIENT PHYSICAL THERAPY THORACOLUMBAR EVALUATION ? ? ?Patient Name: [facility] ?MRN: [id number] ?DOB:1995-11-11, 26 y.o., male ?Today's Date: 05/31/2021 ? ? ? ?Past Medical History:  ?Diagnosis Date  ? Abscess   ? Left Elbow ( requiring I & D)  ? ADHD (attention deficit hyperactivity disorder)   ? Asthma   ? History of substance abuse ([place])   ? IV drug user   ? ODD (oppositional defiant disorder)   ? Tobacco abuse 09/16/2012  ? ?Past Surgical History:  ?Procedure Laterality Date  ? INCISION AND DRAINAGE ABSCESS Left 10/18/2014  ? Procedure: INCISION AND DRAINAGE ABSCESS;  Surgeon: Hubbard Robinson, MD;  Location: [facility] ORS;  Service: General;  Laterality: Left;  ? MASS EXCISION Right 01/10/2019  ? Procedure: EXCISION OF FACIAL MASS;  Surgeon: Leta Baptist, MD;  Location: [place];  Service: ENT;  Laterality: Right;  ? ?Patient Active Problem List  ? Diagnosis Date Noted  ? Uncomplicated asthma 79/39/0300  ? Insomnia 07/26/2017  ? Seasonal allergies 07/26/2017  ? Cellulitis and abscess 10/18/2014  ? Cellulitis of left upper extremity   ? Abscess 10/16/2014  ? Polysubstance dependence including opioid drug with daily use ([place]) 10/13/2014  ? Tobacco abuse 09/16/2012  ? ODD (oppositional defiant disorder)   ? History of substance abuse ([place])   ? Back pain 08/06/2012  ? ADHD (attention deficit hyperactivity disorder)   ? ? ?PCP: Jenna Luo, MD ? ?REFERRING PROVIDER: Ashok Pall, MD  ? ?REFERRING DIAG: M54.42 (ICD-10-CM) - Lumbago with sciatica, left side  ? ?THERAPY DIAG:  ?No diagnosis found. ? ?ONSET DATE: *** ? ?SUBJECTIVE:                                                                                                                                                                                          ? ?SUBJECTIVE STATEMENT: ?*** ?PERTINENT HISTORY:  ?*** ? ?PAIN:  ?Are you having pain? Yes: {yespain:27235::"NPRS scale: ***/10","Pain location: ***","Pain description:  ***","Aggravating factors: ***","Relieving factors: ***"} ? ? ?PRECAUTIONS: {Therapy precautions:24002} ? ?WEIGHT BEARING RESTRICTIONS {Yes ***/No:24003} ? ?FALLS:  ?Has patient fallen in last 6 months? {fallsyesno:27318} ? ?LIVING ENVIRONMENT: ?Lives with: {[facility] lives with:25569::"lives with their family"} ?Lives in: {Lives in:25570} ?Stairs: {opstairs:27293} ?Has following equipment at home: {Assistive devices:23999} ? ?OCCUPATION: *** ? ?PLOF: {PLOF:24004} ? ?PATIENT GOALS *** ? ? ?OBJECTIVE:  ? ?DIAGNOSTIC FINDINGS:  ?*** ? ?PATIENT SURVEYS:  ?{rehab surveys:24030} ? ?SCREENING FOR RED FLAGS: ?Bowel or bladder incontinence: {Yes/No:304960894} ?Spinal tumors: {Yes/No:304960894} ?Cauda equina syndrome: {Yes/No:304960894} ?Compression fracture: {Yes/No:304960894} ?Abdominal aneurysm: {Yes/No:304960894} ? ?COGNITION: ? Overall cognitive status: {cognition:24006}   ?  ?SENSATION: ?{sensation:27233} ? ?MUSCLE  LENGTH: ?Hamstrings: Right *** deg; Left *** deg ?Thomas test: Right *** deg; Left *** deg ? ?POSTURE:  ?*** ? ?PALPATION: ?*** ? ?LUMBAR ROM:  ? ?{AROM/PROM:27142}  A/PROM  ?05/31/2021  ?Flexion   ?Extension   ?Right lateral flexion   ?Left lateral flexion   ?Right rotation   ?Left rotation   ? (Blank rows = not tested) ? ?LE ROM: ? ?{AROM/PROM:27142}  Right ?05/31/2021 Left ?05/31/2021  ?Hip flexion    ?Hip extension    ?Hip abduction    ?Hip adduction    ?Hip internal rotation    ?Hip external rotation    ?Knee flexion    ?Knee extension    ?Ankle dorsiflexion    ?Ankle plantarflexion    ?Ankle inversion    ?Ankle eversion    ? (Blank rows = not tested) ? ?LE MMT: ? ?MMT Right ?05/31/2021 Left ?05/31/2021  ?Hip flexion    ?Hip extension    ?Hip abduction    ?Hip adduction    ?Hip internal rotation    ?Hip external rotation    ?Knee flexion    ?Knee extension    ?Ankle dorsiflexion    ?Ankle plantarflexion    ?Ankle inversion    ?Ankle eversion    ? (Blank rows = not tested) ? ?LUMBAR SPECIAL TESTS:  ?{lumbar special  test:25242} ? ?FUNCTIONAL TESTS:  ?{Functional tests:24029} ? ?GAIT: ?Distance walked: *** ?Assistive device utilized: {Assistive devices:23999} ?Level of assistance: {Levels of assistance:24026} ?Comments: *** ? ? ? ?TODAY'S TREATMENT  ?*** ? ? ?PATIENT EDUCATION:  ?Education details: *** ?Person educated: {Person educated:25204} ?Education method: {Education Method:25205} ?Education comprehension: {Education Comprehension:25206} ? ? ?HOME EXERCISE PROGRAM: ?*** ? ?ASSESSMENT: ? ?CLINICAL IMPRESSION: ?Patient is a *** y.o. *** who was seen today for physical therapy evaluation and treatment for ***.  ? ? ?OBJECTIVE IMPAIRMENTS {opptimpairments:25111}.  ? ?ACTIVITY LIMITATIONS {activity limitations:25113}.  ? ?PERSONAL FACTORS {Personal factors:25162} are also affecting patient's functional outcome.  ? ? ?REHAB POTENTIAL: {rehabpotential:25112} ? ?CLINICAL DECISION MAKING: {clinical decision making:25114} ? ?EVALUATION COMPLEXITY: {Evaluation complexity:25115} ? ? ?GOALS: ?Goals reviewed with patient? {yes/no:20286} ? ?SHORT TERM GOALS: Target date: {follow up:25551} ? ?*** ?Baseline: ?Goal status: {GOALSTATUS:25110} ? ?2.  *** ?Baseline:  ?Goal status: {GOALSTATUS:25110} ? ?3.  *** ?Baseline:  ?Goal status: {GOALSTATUS:25110} ? ?4.  *** ?Baseline:  ?Goal status: {GOALSTATUS:25110} ? ?5.  *** ?Baseline:  ?Goal status: {GOALSTATUS:25110} ? ?6.  *** ?Baseline:  ?Goal status: {GOALSTATUS:25110} ? ?LONG TERM GOALS: Target date: {follow up:25551} ? ?*** ?Baseline:  ?Goal status: {GOALSTATUS:25110} ? ?2.  *** ?Baseline:  ?Goal status: {GOALSTATUS:25110} ? ?3.  *** ?Baseline:  ?Goal status: {GOALSTATUS:25110} ? ?4.  *** ?Baseline:  ?Goal status: {GOALSTATUS:25110} ? ?5.  *** ?Baseline:  ?Goal status: {GOALSTATUS:25110} ? ?6.  *** ?Baseline:  ?Goal status: {GOALSTATUS:25110} ? ? ?PLAN: ?PT FREQUENCY: {rehab frequency:25116} ? ?PT DURATION: {rehab duration:25117} ? ?PLANNED INTERVENTIONS: {rehab planned  interventions:25118::"Therapeutic exercises","Therapeutic activity","Neuromuscular re-education","Balance training","Gait training","Patient/Family education","Joint mobilization"}. ? ?PLAN FOR NEXT SESSION: *** ? ? ?Ardis Rowan, PT ?05/31/2021, 4:17 PM ? ?

## 2021-06-01 ENCOUNTER — Ambulatory Visit (HOSPITAL_COMMUNITY): Payer: Self-pay | Attending: Neurosurgery

## 2021-08-09 ENCOUNTER — Emergency Department (HOSPITAL_COMMUNITY): Payer: Medicaid Other

## 2021-08-09 ENCOUNTER — Inpatient Hospital Stay (HOSPITAL_COMMUNITY)
Admission: EM | Admit: 2021-08-09 | Discharge: 2021-08-30 | DRG: 917 | Disposition: E | Payer: Medicaid Other | Attending: Internal Medicine | Admitting: Internal Medicine

## 2021-08-09 ENCOUNTER — Encounter (HOSPITAL_COMMUNITY): Payer: Self-pay

## 2021-08-09 ENCOUNTER — Encounter: Payer: Self-pay | Admitting: Critical Care Medicine

## 2021-08-09 ENCOUNTER — Other Ambulatory Visit: Payer: Self-pay

## 2021-08-09 ENCOUNTER — Other Ambulatory Visit (HOSPITAL_COMMUNITY): Payer: Self-pay

## 2021-08-09 DIAGNOSIS — T50904A Poisoning by unspecified drugs, medicaments and biological substances, undetermined, initial encounter: Secondary | ICD-10-CM

## 2021-08-09 DIAGNOSIS — J9602 Acute respiratory failure with hypercapnia: Secondary | ICD-10-CM

## 2021-08-09 DIAGNOSIS — J9601 Acute respiratory failure with hypoxia: Secondary | ICD-10-CM

## 2021-08-09 DIAGNOSIS — G253 Myoclonus: Secondary | ICD-10-CM

## 2021-08-09 DIAGNOSIS — Z9911 Dependence on respirator [ventilator] status: Secondary | ICD-10-CM

## 2021-08-09 DIAGNOSIS — I469 Cardiac arrest, cause unspecified: Secondary | ICD-10-CM | POA: Diagnosis present

## 2021-08-09 DIAGNOSIS — Z515 Encounter for palliative care: Secondary | ICD-10-CM | POA: Diagnosis not present

## 2021-08-09 DIAGNOSIS — G931 Anoxic brain damage, not elsewhere classified: Secondary | ICD-10-CM | POA: Diagnosis present

## 2021-08-09 DIAGNOSIS — F1410 Cocaine abuse, uncomplicated: Secondary | ICD-10-CM | POA: Diagnosis present

## 2021-08-09 DIAGNOSIS — Z8249 Family history of ischemic heart disease and other diseases of the circulatory system: Secondary | ICD-10-CM | POA: Diagnosis not present

## 2021-08-09 DIAGNOSIS — Z825 Family history of asthma and other chronic lower respiratory diseases: Secondary | ICD-10-CM

## 2021-08-09 DIAGNOSIS — Z83438 Family history of other disorder of lipoprotein metabolism and other lipidemia: Secondary | ICD-10-CM | POA: Diagnosis not present

## 2021-08-09 DIAGNOSIS — Z66 Do not resuscitate: Secondary | ICD-10-CM | POA: Diagnosis present

## 2021-08-09 DIAGNOSIS — R778 Other specified abnormalities of plasma proteins: Secondary | ICD-10-CM | POA: Diagnosis present

## 2021-08-09 DIAGNOSIS — T50901A Poisoning by unspecified drugs, medicaments and biological substances, accidental (unintentional), initial encounter: Secondary | ICD-10-CM | POA: Diagnosis present

## 2021-08-09 DIAGNOSIS — R7401 Elevation of levels of liver transaminase levels: Secondary | ICD-10-CM | POA: Diagnosis present

## 2021-08-09 DIAGNOSIS — R739 Hyperglycemia, unspecified: Secondary | ICD-10-CM | POA: Diagnosis present

## 2021-08-09 DIAGNOSIS — G935 Compression of brain: Secondary | ICD-10-CM | POA: Diagnosis present

## 2021-08-09 DIAGNOSIS — Z7189 Other specified counseling: Secondary | ICD-10-CM | POA: Diagnosis not present

## 2021-08-09 DIAGNOSIS — G936 Cerebral edema: Secondary | ICD-10-CM | POA: Diagnosis present

## 2021-08-09 DIAGNOSIS — J690 Pneumonitis due to inhalation of food and vomit: Secondary | ICD-10-CM | POA: Diagnosis present

## 2021-08-09 DIAGNOSIS — F17210 Nicotine dependence, cigarettes, uncomplicated: Secondary | ICD-10-CM | POA: Diagnosis present

## 2021-08-09 DIAGNOSIS — N179 Acute kidney failure, unspecified: Secondary | ICD-10-CM | POA: Diagnosis present

## 2021-08-09 DIAGNOSIS — F1210 Cannabis abuse, uncomplicated: Secondary | ICD-10-CM | POA: Diagnosis present

## 2021-08-09 DIAGNOSIS — I468 Cardiac arrest due to other underlying condition: Secondary | ICD-10-CM | POA: Diagnosis present

## 2021-08-09 DIAGNOSIS — R569 Unspecified convulsions: Secondary | ICD-10-CM | POA: Diagnosis not present

## 2021-08-09 HISTORY — DX: Oppositional defiant disorder: F91.3

## 2021-08-09 HISTORY — DX: Tobacco use: Z72.0

## 2021-08-09 HISTORY — DX: Other psychoactive substance use, unspecified, uncomplicated: F19.90

## 2021-08-09 HISTORY — DX: Attention-deficit hyperactivity disorder, unspecified type: F90.9

## 2021-08-09 HISTORY — DX: Cutaneous abscess, unspecified: L02.91

## 2021-08-09 LAB — I-STAT ARTERIAL BLOOD GAS, ED
Acid-base deficit: 9 mmol/L — ABNORMAL HIGH (ref 0.0–2.0)
Acid-base deficit: 10 mmol/L — ABNORMAL HIGH (ref 0.0–2.0)
Bicarbonate: 19.5 mmol/L — ABNORMAL LOW (ref 20.0–28.0)
Bicarbonate: 17.3 mmol/L — ABNORMAL LOW (ref 20.0–28.0)
Calcium, Ion: 1.22 mmol/L (ref 1.15–1.40)
Calcium, Ion: 1.25 mmol/L (ref 1.15–1.40)
HCT: 46 % (ref 39.0–52.0)
HCT: 47 % (ref 39.0–52.0)
Hemoglobin: 15.6 g/dL (ref 13.0–17.0)
Hemoglobin: 16 g/dL (ref 13.0–17.0)
O2 Saturation: 100 %
O2 Saturation: 99 %
Patient temperature: 98.6
Patient temperature: 98.6
Potassium: 4.5 mmol/L (ref 3.5–5.1)
Potassium: 4.3 mmol/L (ref 3.5–5.1)
Sodium: 139 mmol/L (ref 135–145)
Sodium: 141 mmol/L (ref 135–145)
TCO2: 21 mmol/L — ABNORMAL LOW (ref 22–32)
TCO2: 18 mmol/L — ABNORMAL LOW (ref 22–32)
pCO2 arterial: 49.9 mmHg — ABNORMAL HIGH (ref 32–48)
pCO2 arterial: 40.5 mmHg (ref 32–48)
pH, Arterial: 7.199 — CL (ref 7.35–7.45)
pH, Arterial: 7.238 — ABNORMAL LOW (ref 7.35–7.45)
pO2, Arterial: 542 mmHg — ABNORMAL HIGH (ref 83–108)
pO2, Arterial: 169 mmHg — ABNORMAL HIGH (ref 83–108)

## 2021-08-09 LAB — URINALYSIS, ROUTINE W REFLEX MICROSCOPIC
Bilirubin Urine: NEGATIVE
Glucose, UA: NEGATIVE mg/dL
Ketones, ur: NEGATIVE mg/dL
Leukocytes,Ua: NEGATIVE
Nitrite: NEGATIVE
Protein, ur: 30 mg/dL — AB
Specific Gravity, Urine: 1.023 (ref 1.005–1.030)
pH: 5 (ref 5.0–8.0)

## 2021-08-09 LAB — COMPREHENSIVE METABOLIC PANEL
ALT: 70 U/L — ABNORMAL HIGH (ref 0–44)
AST: 68 U/L — ABNORMAL HIGH (ref 15–41)
Albumin: 4.3 g/dL (ref 3.5–5.0)
Alkaline Phosphatase: 77 U/L (ref 38–126)
Anion gap: 19 — ABNORMAL HIGH (ref 5–15)
BUN: 15 mg/dL (ref 6–20)
CO2: 14 mmol/L — ABNORMAL LOW (ref 22–32)
Calcium: 8.4 mg/dL — ABNORMAL LOW (ref 8.9–10.3)
Chloride: 105 mmol/L (ref 98–111)
Creatinine, Ser: 1.57 mg/dL — ABNORMAL HIGH (ref 0.61–1.24)
GFR, Estimated: >60 mL/min (ref >60)
Glucose, Bld: 228 mg/dL — ABNORMAL HIGH (ref 70–99)
Potassium: 3.8 mmol/L (ref 3.5–5.1)
Sodium: 138 mmol/L (ref 135–145)
Total Bilirubin: 1.1 mg/dL (ref 0.3–1.2)
Total Protein: 6.8 g/dL (ref 6.5–8.1)

## 2021-08-09 LAB — CBC WITH DIFFERENTIAL/PLATELET
Abs Immature Granulocytes: 0.05 10*3/uL (ref 0.00–0.07)
Basophils Absolute: 0 10*3/uL (ref 0.0–0.1)
Basophils Relative: 0 %
Eosinophils Absolute: 0 10*3/uL (ref 0.0–0.5)
Eosinophils Relative: 0 %
HCT: 41 % (ref 39.0–52.0)
Hemoglobin: 14.7 g/dL (ref 13.0–17.0)
Immature Granulocytes: 1 %
Lymphocytes Relative: 28 %
Lymphs Abs: 1.7 10*3/uL (ref 0.7–4.0)
MCH: 32 pg (ref 26.0–34.0)
MCHC: 35.9 g/dL (ref 30.0–36.0)
MCV: 89.3 fL (ref 80.0–100.0)
Monocytes Absolute: 0.2 10*3/uL (ref 0.1–1.0)
Monocytes Relative: 3 %
Neutro Abs: 4.2 10*3/uL (ref 1.7–7.7)
Neutrophils Relative %: 68 %
Platelets: 272 10*3/uL (ref 150–400)
RBC: 4.59 MIL/uL (ref 4.22–5.81)
RDW: 13 % (ref 11.5–15.5)
WBC: 6.2 10*3/uL (ref 4.0–10.5)
nRBC: 0 % (ref 0.0–0.2)

## 2021-08-09 LAB — TROPONIN I (HIGH SENSITIVITY)
Troponin I (High Sensitivity): 91 ng/L — ABNORMAL HIGH (ref <18)
Troponin I (High Sensitivity): 373 ng/L (ref <18)

## 2021-08-09 LAB — CBC
HCT: 46.5 % (ref 39.0–52.0)
Hemoglobin: 16.2 g/dL (ref 13.0–17.0)
MCH: 31.3 pg (ref 26.0–34.0)
MCHC: 34.8 g/dL (ref 30.0–36.0)
MCV: 89.9 fL (ref 80.0–100.0)
Platelets: 283 10*3/uL (ref 150–400)
RBC: 5.17 MIL/uL (ref 4.22–5.81)
RDW: 13 % (ref 11.5–15.5)
WBC: 16.6 10*3/uL — ABNORMAL HIGH (ref 4.0–10.5)
nRBC: 0 % (ref 0.0–0.2)

## 2021-08-09 LAB — HEMOGLOBIN A1C
Hgb A1c MFr Bld: 4.8 % (ref 4.8–5.6)
Mean Plasma Glucose: 91.06 mg/dL

## 2021-08-09 LAB — RAPID URINE DRUG SCREEN, HOSP PERFORMED
Amphetamines: NOT DETECTED
Barbiturates: NOT DETECTED
Benzodiazepines: NOT DETECTED
Cocaine: POSITIVE — AB
Opiates: NOT DETECTED
Tetrahydrocannabinol: POSITIVE — AB

## 2021-08-09 LAB — CBG MONITORING, ED: Glucose-Capillary: 141 mg/dL — ABNORMAL HIGH (ref 70–99)

## 2021-08-09 LAB — HIV ANTIBODY (ROUTINE TESTING W REFLEX): HIV Screen 4th Generation wRfx: NONREACTIVE

## 2021-08-09 LAB — ACETAMINOPHEN LEVEL: Acetaminophen (Tylenol), Serum: <10 ug/mL — ABNORMAL LOW (ref 10–30)

## 2021-08-09 LAB — ETHANOL: Alcohol, Ethyl (B): 93 mg/dL — ABNORMAL HIGH (ref <10)

## 2021-08-09 LAB — SALICYLATE LEVEL: Salicylate Lvl: <7 mg/dL — ABNORMAL LOW (ref 7.0–30.0)

## 2021-08-09 MED ORDER — FENTANYL 2500MCG IN NS 250ML (10MCG/ML) PREMIX INFUSION
50.0000 ug/h | INTRAVENOUS | Status: DC
  Administered 2021-08-09: 50 ug/h via INTRAVENOUS
  Filled 2021-08-09: qty 250

## 2021-08-09 MED ORDER — VITAL HIGH PROTEIN PO LIQD
1000.0000 mL | ORAL | Status: DC
Start: 2021-08-09 — End: 2021-08-11
  Administered 2021-08-10 (×2): 1000 mL

## 2021-08-09 MED ORDER — DOCUSATE SODIUM 50 MG/5ML PO LIQD
100.0000 mg | Freq: Two times a day (BID) | ORAL | Status: DC | PRN
Start: 2021-08-09 — End: 2021-08-11

## 2021-08-09 MED ORDER — DOCUSATE SODIUM 100 MG PO CAPS: 100.0000 mg | ORAL_CAPSULE | Freq: Two times a day (BID) | ORAL | Status: DC | PRN

## 2021-08-09 MED ORDER — PANTOPRAZOLE SODIUM 40 MG PO TBEC: 40.0000 mg | DELAYED_RELEASE_TABLET | Freq: Every day | ORAL | Status: DC

## 2021-08-09 MED ORDER — BUSPIRONE HCL 15 MG PO TABS: 30.0000 mg | ORAL_TABLET | Freq: Three times a day (TID) | ORAL | Status: DC | PRN

## 2021-08-09 MED ORDER — FENTANYL CITRATE PF 50 MCG/ML IJ SOSY: 50.0000 ug | PREFILLED_SYRINGE | Freq: Once | INTRAMUSCULAR | Status: DC

## 2021-08-09 MED ORDER — ACETAMINOPHEN 325 MG PO TABS: 650.0000 mg | ORAL_TABLET | ORAL | Status: DC | PRN

## 2021-08-09 MED ORDER — INSULIN ASPART 100 UNIT/ML IJ SOLN
1.0000 [IU] | INTRAMUSCULAR | Status: DC
  Administered 2021-08-09 – 2021-08-10 (×2): 1 [IU] via SUBCUTANEOUS
  Administered 2021-08-11: 2 [IU] via SUBCUTANEOUS
  Administered 2021-08-11: 3 [IU] via SUBCUTANEOUS

## 2021-08-09 MED ORDER — ONDANSETRON HCL 4 MG/2ML IJ SOLN: 4.0000 mg | Freq: Four times a day (QID) | INTRAMUSCULAR | Status: DC | PRN

## 2021-08-09 MED ORDER — ACETAMINOPHEN 650 MG RE SUPP: 650.0000 mg | RECTAL | Status: DC | PRN

## 2021-08-09 MED ORDER — PROSOURCE TF PO LIQD
45.0000 mL | Freq: Two times a day (BID) | ORAL | Status: DC
  Administered 2021-08-10 (×2): 45 mL
  Filled 2021-08-09 (×2): qty 45

## 2021-08-09 MED ORDER — DOCUSATE SODIUM 50 MG/5ML PO LIQD
100.0000 mg | Freq: Two times a day (BID) | ORAL | Status: DC
  Administered 2021-08-10 (×2): 100 mg
  Filled 2021-08-09 (×2): qty 10

## 2021-08-09 MED ORDER — NOREPINEPHRINE 4 MG/250ML-% IV SOLN: 2.0000 ug/min | INTRAVENOUS | Status: DC

## 2021-08-09 MED ORDER — ALBUTEROL SULFATE (2.5 MG/3ML) 0.083% IN NEBU: 2.5000 mg | INHALATION_SOLUTION | RESPIRATORY_TRACT | Status: DC | PRN

## 2021-08-09 MED ORDER — SODIUM CHLORIDE 0.9 % IV SOLN
2.0000 g | INTRAVENOUS | Status: DC
  Administered 2021-08-09 – 2021-08-10 (×2): 2 g via INTRAVENOUS
  Filled 2021-08-09 (×2): qty 20

## 2021-08-09 MED ORDER — FENTANYL BOLUS VIA INFUSION: 50.0000 ug | INTRAVENOUS | Status: DC | PRN

## 2021-08-09 MED ORDER — ACETAMINOPHEN 160 MG/5ML PO SOLN: 650.0000 mg | ORAL | Status: DC | PRN

## 2021-08-09 MED ORDER — PROPOFOL BOLUS VIA INFUSION
50.0000 mg | Freq: Once | INTRAVENOUS | Status: AC
  Administered 2021-08-09: 50 mg via INTRAVENOUS
  Filled 2021-08-09: qty 50

## 2021-08-09 MED ORDER — HEPARIN SODIUM (PORCINE) 5000 UNIT/ML IJ SOLN
5000.0000 [IU] | Freq: Three times a day (TID) | INTRAMUSCULAR | Status: DC
  Administered 2021-08-09 – 2021-08-11 (×5): 5000 [IU] via SUBCUTANEOUS
  Filled 2021-08-09 (×5): qty 1

## 2021-08-09 MED ORDER — PANTOPRAZOLE 2 MG/ML SUSPENSION
40.0000 mg | Freq: Every day | ORAL | Status: DC
  Administered 2021-08-10 (×2): 40 mg
  Filled 2021-08-09 (×3): qty 20

## 2021-08-09 MED ORDER — POLYETHYLENE GLYCOL 3350 17 G PO PACK
17.0000 g | PACK | Freq: Every day | ORAL | Status: DC
  Administered 2021-08-10: 17 g
  Filled 2021-08-09: qty 1

## 2021-08-09 MED ORDER — PROPOFOL 1000 MG/100ML IV EMUL
INTRAVENOUS | Status: AC
  Filled 2021-08-09: qty 100

## 2021-08-09 MED ORDER — MAGNESIUM SULFATE 2 GM/50ML IV SOLN: 2.0000 g | Freq: Once | INTRAVENOUS | Status: DC | PRN

## 2021-08-09 MED ORDER — SODIUM CHLORIDE 0.9 % IV SOLN
250.0000 mL | INTRAVENOUS | Status: DC
  Administered 2021-08-10: 250 mL via INTRAVENOUS

## 2021-08-09 MED ORDER — PROPOFOL 1000 MG/100ML IV EMUL
5.0000 ug/kg/min | INTRAVENOUS | Status: DC
  Administered 2021-08-09: 5 ug/kg/min via INTRAVENOUS
  Administered 2021-08-10 (×2): 60 ug/kg/min via INTRAVENOUS
  Administered 2021-08-10: 80 ug/kg/min via INTRAVENOUS
  Administered 2021-08-10: 100 ug/kg/min via INTRAVENOUS
  Administered 2021-08-10: 60 ug/kg/min via INTRAVENOUS
  Administered 2021-08-10: 100 ug/kg/min via INTRAVENOUS
  Administered 2021-08-10 (×2): 80 ug/kg/min via INTRAVENOUS
  Administered 2021-08-10 – 2021-08-11 (×4): 100 ug/kg/min via INTRAVENOUS
  Filled 2021-08-09 (×10): qty 100
  Filled 2021-08-09: qty 200
  Filled 2021-08-09: qty 100

## 2021-08-09 MED ORDER — ACETAMINOPHEN 650 MG RE SUPP
650.0000 mg | RECTAL | Status: DC
  Administered 2021-08-09: 650 mg via RECTAL
  Filled 2021-08-09: qty 1

## 2021-08-09 MED ORDER — PROPOFOL 1000 MG/100ML IV EMUL: 5.0000 ug/kg/min | INTRAVENOUS | Status: DC

## 2021-08-09 MED ORDER — LEVETIRACETAM 500 MG/5ML IV SOLN
4000.0000 mg | Freq: Two times a day (BID) | INTRAVENOUS | Status: DC
  Administered 2021-08-09: 4000 mg via INTRAVENOUS
  Filled 2021-08-09 (×2): qty 40

## 2021-08-09 MED ORDER — POLYETHYLENE GLYCOL 3350 17 G PO PACK: 17.0000 g | PACK | Freq: Every day | ORAL | Status: DC | PRN

## 2021-08-09 MED ORDER — ACETAMINOPHEN 160 MG/5ML PO SOLN
650.0000 mg | ORAL | Status: DC
  Administered 2021-08-10 (×3): 650 mg
  Filled 2021-08-09 (×3): qty 20.3

## 2021-08-09 MED ORDER — SODIUM BICARBONATE 8.4 % IV SOLN
100.0000 meq | Freq: Once | INTRAVENOUS | Status: AC
  Administered 2021-08-09: 100 meq via INTRAVENOUS
  Filled 2021-08-09: qty 50

## 2021-08-09 MED ORDER — ACETAMINOPHEN 325 MG PO TABS
650.0000 mg | ORAL_TABLET | ORAL | Status: DC
  Administered 2021-08-10 – 2021-08-11 (×4): 650 mg
  Filled 2021-08-09 (×5): qty 2

## 2021-08-09 NOTE — Consult Note (Signed)
Neurology Consultation Reason for Consult: Myoclonus Referring Physician: Chestine Spore, L  CC: Myoclonus  History is obtained from:chart review  HPI: Frank Duran is a 26 y.o. male with a history of known substance abuse including heroin who got out of jail this morning and then was in the bathroom for about 5 minutes when he was found gasping and pulseless.  Bystander CPR was initiated.  EMS had another 10 minutes of CPR prior to ROSC.  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  No past medical history on file.   No family history on file.   Social History:  reports current drug use. No history on file for tobacco use and alcohol use.   Exam: Current vital signs: BP (!) 146/103   Pulse (!) 107   Temp 98.8 F (37.1 C)   Resp 18   Wt 75 kg   SpO2 100%  Vital signs in last 24 hours: Temp:  [98.5 F (36.9 C)-98.8 F (37.1 C)] 98.8 F (37.1 C) (07/11 2030) Pulse Rate:  [101-117] 107 (07/11 2030) Resp:  [18-21] 18 (07/11 2030) BP: (146-163)/(95-107) 146/103 (07/11 2030) SpO2:  [95 %-100 %] 100 % (07/11 2056) FiO2 (%):  [40 %-100 %] 40 % (07/11 2056) Weight:  [75 kg] 75 kg (07/11 2005)   Physical Exam  Constitutional: Appears well-developed and well-nourished.   Neuro: Mental Status: Patient is comatose, does not open eyes or follow commands.  Cranial Nerves: II: does nto blink to threat. Pupils are equal, round, and reactive to light, 3-83mm bilaterally.   III,IV, VI: no response to doll's maneuver V: VII: corneals are absent.  Motor: Minimal flexion in RUE, otherwise no response to nox stim.  Sensory: As above Cerebellar: Does not perform  He has stimulus induced genrealized myoclonus.       I have reviewed labs in epic and the results pertinent to this consultation are: UDS + for cocaine, thc Cr 1.57  I have reviewed the images obtained: CT  head - read ass normal, but appears to me to have some loss of sulci, concerned for early CT change from anoxia.   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 Given his young age, would continue treatment for now, but if CT shows devestating injury in the AM, may need to re-address goals of care. I have high concern for poor prognosis, but again, given his young age would continue aggressive care for now.   Recommendations: 1) continuous EEG overnight 2) continue Keppra 1 g twice daily 3) repeat head CT in the morning 4) neurology will continue to follow  This patient is critically ill and at significant risk of neurological worsening, death and care requires constant monitoring of vital signs, hemodynamics,respiratory and cardiac monitoring, neurological assessment, discussion with family, other specialists and medical decision making of high complexity. I spent 40 minutes of neurocritical care time  in the care of  this patient. This was time spent independent of any time provided by nurse practitioner or PA.  Ritta Slot, MD [facility] [phone]  If 7pm- 7am, please page neurology on call as listed in AMION. 08/10/2021  12:48 AM

## 2021-08-09 NOTE — ED Triage Notes (Signed)
Pt arrived via EMS, post cardiac arrest with ROSC in field after 2mg  narcan (did not respond) 2 rounds epi, 2 defib shocks, and 5 rounds of CPR. Pt got out of jail this AM, per EMS, brother stated he was in restroom for 5 minutes and brother was driving home, pt started gasping and went unresponsive. Pt down for unknown amount of time. Pt known heroin and polysub user.

## 2021-08-09 NOTE — ED Provider Notes (Signed)
Healthy young male with history of drug abuse, recently incarcerated, presenting after being found by family unresponsive and pulseless.  Bystander CPR was initiated.  EMS arrived on scene and provided an additional 10 minutes of CPR.  Patient received 2 defibrillatory shocks and 2 rounds of epinephrine prior to ROSC.  Patient also received 2 mg of Narcan with EMS.  King airway was placed prior to arrival.  On arrival, patient is a GCS of 3.  He is tachycardic and normotensive.  Definitive airway was placed.  Care of patient was assumed by Dr. Silverio Lay.  Date/Time: 08/09/2021 6:05 PM  Performed by: Gloris Manchester, MDPre-anesthesia Checklist: Suction available and Patient being monitored Oxygen Delivery Method: Ambu bag Preoxygenation: Pre-oxygenation with 100% oxygen Laryngoscope Size: Glidescope and 4 Grade View: Grade I Tube size: 7.5 mm Number of attempts: 1 Airway Equipment and Method: Rigid stylet and Video-laryngoscopy Placement Confirmation: Positive ETCO2, ETT inserted through vocal cords under direct vision and Breath sounds checked- equal and bilateral Secured at: 25 cm Tube secured with: ETT holder Dental Injury: Teeth and Oropharynx as per pre-operative assessment         Gloris Manchester, MD 08/09/21 1808

## 2021-08-09 NOTE — Progress Notes (Signed)
ED RN told me patient was moving upstairs very soon. Currently waiting on patient to arrive to 3M10.

## 2021-08-09 NOTE — H&P (Signed)
NAME:  Tyrell Seifer, MRN:  [id number], DOB:  [birth year], LOS: 0 ADMISSION DATE:  08/09/2021, CONSULTATION DATE: 08/09/2021 REFERRING MD:  Jamal Maes, CHIEF COMPLAINT: Cardiac arrest  History of Present Illness:  Mr. Boxley is a 26 y/o gentleman with a history of drug abuse who presented after witnessed cardiac arrest this morning.  Per his aunt he had gotten out of prison this morning and was planning to go to a drug rehab program in [place] starting tomorrow.  His cardiac arrest in the car was witnessed by his cousins.  When they got to her house they started bystander CPR until EMS got there.  He did not respond to Narcan in the field.  He received 2 rounds of epinephrine, due to defibrillation shocks, and 5 minutes of CPR.  He was intubated in the field with a King airway, switched from endotracheal tube in the ED.   Pertinent  Medical History  Drug abuse ADHD Oppositional defiant disorder  Significant Hospital Events: Including procedures, antibiotic start and stop dates in addition to other pertinent events   7/11 admission, started on EEG  Interim History / Subjective:    Objective   Blood pressure (!) 146/103, pulse (!) 107, temperature 98.8 F (37.1 C), resp. rate 18, weight 75 kg, SpO2 100 %.    Vent Mode: PRVC FiO2 (%):  [40 %-100 %] 40 % Set Rate:  [18 bmp] 18 bmp Vt Set:  [500 mL-580 mL] 580 mL PEEP:  [5 cmH20] 5 cmH20  No intake or output data in the 24 hours ending 08/09/21 2137 Filed Weights   08/09/21 1840 08/09/21 2005  Weight: 75 kg 75 kg    Examination: General: Critically ill-appearing man lying in bed no acute distress, sedated on propofol, intubated HENT: [place]/AT, old scars on his head, endotracheal tube in place Lungs: CTAB, synchronous with the vent Cardiovascular: S1 S2, regular rate and rhythm Abdomen: Soft, nontender Extremities: No peripheral edema, right tibial IO Neuro: RASS -5 on propofol, no response to painful stimulation in  trapezius, 4 extremities.  Intact pupillary reflexes, no corneal reflexes.  Biting on endotracheal tube, unable to assess for pharyngeal gag reflex, no tracheal cough reflex.  Whole body myoclonus during stimulation Derm: Warm, dry  CT head reviewed-very few sulci visible, early loss of gray-white differentiation per my review  ABG    Component Value Date/Time   PHART 7.199 (LL) 08/09/2021 2049   PCO2ART 49.9 (H) 08/09/2021 2049   PO2ART 542 (H) 08/09/2021 2049   HCO3 19.5 (L) 08/09/2021 2049   TCO2 21 (L) 08/09/2021 2049   ACIDBASEDEF 9.0 (H) 08/09/2021 2049   O2SAT 100 08/09/2021 2049   Troponin 91 Ethanol 93 Salicylates undetectable Acetaminophen undetectable UDS positive for cocaine, THC Bicarb 14 BUN 15 Creatinine 1.57 AST 68 ALT 70 CXR personally reviewed-endotracheal tube about 5 cm above the carina, OG tube in appropriate position.  No infiltrates.  Resolved Hospital Problem list     Assessment & Plan:  Cardiac arrest, presumed related to recent drug use -supportive care, head of bed greater than 30 degrees -monitor on tele -monitor & optimize electrolytes -TTM normothermia -Echocardiogram tomorrow  Acute respiratory failure with hypoxia requiring MV post-arrest -LTV V - VAP prevention protocol - PAD protocol for sedation - Daily SAT and SBT as appropriate.  Mental status currently precludes extubation. -Vent adjustments, will need follow-up ABG again -Empiric antibiotics for aspiration  Concern for anoxic brain injury, myoclonus -Neurology consult> recommend adding keppra load to propofol -cEEG  AKI, likely prerenal versus ATN from prolonged downtime - Strict I's/O - Renally dose meds and avoid nephrotoxic meds - Continue to monitor  Elevated transaminases, likely due to cardiac arrest.  High risk for shock liver - Monitor - Avoid hepatotoxic medications  Drug abuse-cocaine, marijuana, concern for synthetic opiates -Send out fentanyl  level -Check HIV - Can counsel on the importance of avoiding drugs when appropriate.  Agree with plan for aggressive drug rehabilitation  Hyperglycemia - Check A1c - Sliding scale insulin as needed   Attempted to call mother Glendora Score twice at number listed in chart- the person who answered said she was asleep and could not come to the phone. I identified myself and let them know who the call was regarding they said no one else was available to come to the phone. When I asked her to call the hospital they yelled for her to call the hospital then hung up the phone. Called Aunt- [employer], she is aware of what is going on and was present when EMS arrived.  She has updated his mother.   Best Practice (right click and "Reselect all SmartList Selections" daily)   Diet/type: tubefeeds DVT prophylaxis: prophylactic heparin  GI prophylaxis: PPI Lines: N/A Foley:  Yes, and it is still needed Code Status:  full code Last date of multidisciplinary goals of care discussion [ ]   Labs   CBC: Recent Labs  Lab 08/09/21 1806 08/09/21 2049  WBC 6.2  --   NEUTROABS 4.2  --   HGB 14.7 15.6  HCT 41.0 46.0  MCV 89.3  --   PLT 272  --     Basic Metabolic Panel: Recent Labs  Lab 08/09/21 1806 08/09/21 2049  NA 138 139  K 3.8 4.5  CL 105  --   CO2 14*  --   GLUCOSE 228*  --   BUN 15  --   CREATININE 1.57*  --   CALCIUM 8.4*  --    GFR: CrCl cannot be calculated (Unknown ideal weight.). Recent Labs  Lab 08/09/21 1806  WBC 6.2    Liver Function Tests: Recent Labs  Lab 08/09/21 1806  AST 68*  ALT 70*  ALKPHOS 77  BILITOT 1.1  PROT 6.8  ALBUMIN 4.3   No results for input(s): "LIPASE", "AMYLASE" in the last 168 hours. No results for input(s): "AMMONIA" in the last 168 hours.  ABG    Component Value Date/Time   PHART 7.199 (LL) 08/09/2021 2049   PCO2ART 49.9 (H) 08/09/2021 2049   PO2ART 542 (H) 08/09/2021 2049   HCO3 19.5 (L) 08/09/2021 2049   TCO2 21 (L)  08/09/2021 2049   ACIDBASEDEF 9.0 (H) 08/09/2021 2049   O2SAT 100 08/09/2021 2049     Coagulation Profile: No results for input(s): "INR", "PROTIME" in the last 168 hours.  Cardiac Enzymes: No results for input(s): "CKTOTAL", "CKMB", "CKMBINDEX", "TROPONINI" in the last 168 hours.  HbA1C: No results found for: "HGBA1C"  CBG: No results for input(s): "GLUCAP" in the last 168 hours.  Review of Systems:   Unable   Past Medical History:  He,  has a past medical history of Abscess, ADHD, IVDU (intravenous drug user), Oppositional defiant disorder, and Tobacco abuse.   Surgical History:   Past Surgical History:  Procedure Laterality Date   ABCESS DRAINAGE       Social History:   reports that he has been smoking cigarettes. He has a 2.50 pack-year smoking history. He uses smokeless tobacco.   Family  History:  His family history includes COPD in his mother; Heart disease in his paternal grandfather and paternal grandmother; Hyperlipidemia in his father.   Allergies No Known Allergies   Home Medications  Prior to Admission medications   Not on File     Critical care time: 55 min.      Steffanie Dunn, DO 08/09/21 9:37 PM [facility]

## 2021-08-09 NOTE — ED Notes (Signed)
Time log of events:  Pt arrived at 1750 bagged by EMS, not breathing, strong pulses 1800 - 18G started to RAC 1802 - intubated with 7.5 ett, 26cm at lip 1806 - cxr for placement ETT, shown to be deep 1814 - ett repositioned to 22cm at lip 1815 - propofol drip started at 50mcg/kg/min after 50mg  bolus via pump with MD in room 1817 - 18Fr og tube inserted, placement verified by auscultation and then CXR 1824 - propofol increased to 67mcg/kg/min per verbal provider statement 1829 - 16Fr Foley placed temperature sensing using sterile technique 1845 - 18G IV started to LFA and blood obtained

## 2021-08-09 NOTE — Progress Notes (Signed)
eLink Physician-Brief Progress Note Patient Name: Frank Duran DOB: [birth year] MRN: [id number]   Date of Service  08/09/2021  HPI/Events of Note  ABG on 40%/PRVC 18/TV 580/P 5 = 7.238/40.5/169/17.3.  eICU Interventions  Plan: NaHCO3 100 meq IV X 1. Increase PRVC rate to 24. Repeat ABG at 12 midnight.      Intervention Category Major Interventions: Acid-Base disturbance - evaluation and management;Respiratory failure - evaluation and management  Lenell Antu 08/09/2021, 10:27 PM

## 2021-08-09 NOTE — Progress Notes (Signed)
Still waiting on EVS to finish cleaning.

## 2021-08-09 NOTE — ED Notes (Signed)
Pt transported to CT with RN and RT

## 2021-08-09 NOTE — Progress Notes (Signed)
Bite block was placed as per MD request.

## 2021-08-09 NOTE — Progress Notes (Signed)
Room has been cleaned. Waiting on patient arrival.

## 2021-08-09 NOTE — Progress Notes (Signed)
   08/09/21 1750  Clinical Encounter Type  Visited With Patient not available  Visit Type Trauma  Referral From Nurse  Consult/Referral To Chaplain   Chaplain Tery Sanfilippo responded to the page. The patient is being attended to by the medical team. There is no support person present. Chaplain remains available for follow up spiritual/emotional support as needed. This note was prepared by Deneen Harts, M.Div..  For questions please contact by phone [phone].

## 2021-08-09 NOTE — ED Notes (Signed)
Pt returned from CT with RN.

## 2021-08-09 NOTE — Progress Notes (Signed)
EVS has finally arrived to clean room.

## 2021-08-09 NOTE — Progress Notes (Signed)
Patient still hasn't arrived to unit.

## 2021-08-09 NOTE — Progress Notes (Signed)
Protocol consult received for US guided IV due to vasopressor order. Spoke with RN [place] via secure chat. Vasopressor is currently not infusing; BP as charted on flowsheet. Pt has 2 working IV sites documented. Requested RN to notify VAS Team of any changes.
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
## 2021-08-10 ENCOUNTER — Inpatient Hospital Stay (HOSPITAL_COMMUNITY): Payer: Medicaid Other

## 2021-08-10 DIAGNOSIS — Z66 Do not resuscitate: Secondary | ICD-10-CM

## 2021-08-10 DIAGNOSIS — R569 Unspecified convulsions: Secondary | ICD-10-CM

## 2021-08-10 DIAGNOSIS — I469 Cardiac arrest, cause unspecified: Secondary | ICD-10-CM

## 2021-08-10 DIAGNOSIS — Z7189 Other specified counseling: Secondary | ICD-10-CM

## 2021-08-10 DIAGNOSIS — Z515 Encounter for palliative care: Secondary | ICD-10-CM

## 2021-08-10 LAB — GLUCOSE, CAPILLARY
Glucose-Capillary: 109 mg/dL — ABNORMAL HIGH (ref 70–99)
Glucose-Capillary: 110 mg/dL — ABNORMAL HIGH (ref 70–99)
Glucose-Capillary: 143 mg/dL — ABNORMAL HIGH (ref 70–99)
Glucose-Capillary: 75 mg/dL (ref 70–99)
Glucose-Capillary: 86 mg/dL (ref 70–99)
Glucose-Capillary: 86 mg/dL (ref 70–99)
Glucose-Capillary: 86 mg/dL (ref 70–99)

## 2021-08-10 LAB — POCT I-STAT 7, (LYTES, BLD GAS, ICA,H+H)
Acid-Base Excess: 1 mmol/L (ref 0.0–2.0)
Acid-Base Excess: 2 mmol/L (ref 0.0–2.0)
Bicarbonate: 23.4 mmol/L (ref 20.0–28.0)
Bicarbonate: 25 mmol/L (ref 20.0–28.0)
Calcium, Ion: 1.09 mmol/L — ABNORMAL LOW (ref 1.15–1.40)
Calcium, Ion: 1.13 mmol/L — ABNORMAL LOW (ref 1.15–1.40)
HCT: 40 % (ref 39.0–52.0)
HCT: 41 % (ref 39.0–52.0)
Hemoglobin: 13.6 g/dL (ref 13.0–17.0)
Hemoglobin: 13.9 g/dL (ref 13.0–17.0)
O2 Saturation: 100 %
O2 Saturation: 100 %
Patient temperature: 33.6
Patient temperature: 98.7
Potassium: 3.7 mmol/L (ref 3.5–5.1)
Potassium: 3.8 mmol/L (ref 3.5–5.1)
Sodium: 142 mmol/L (ref 135–145)
Sodium: 142 mmol/L (ref 135–145)
TCO2: 24 mmol/L (ref 22–32)
TCO2: 26 mmol/L (ref 22–32)
pCO2 arterial: 27.8 mmHg — ABNORMAL LOW (ref 32–48)
pCO2 arterial: 32.4 mmHg (ref 32–48)
pH, Arterial: 7.481 — ABNORMAL HIGH (ref 7.35–7.45)
pH, Arterial: 7.533 — ABNORMAL HIGH (ref 7.35–7.45)
pO2, Arterial: 170 mmHg — ABNORMAL HIGH (ref 83–108)
pO2, Arterial: 184 mmHg — ABNORMAL HIGH (ref 83–108)

## 2021-08-10 LAB — CBC
HCT: 42.7 % (ref 39.0–52.0)
Hemoglobin: 15.3 g/dL (ref 13.0–17.0)
MCH: 31.8 pg (ref 26.0–34.0)
MCHC: 35.8 g/dL (ref 30.0–36.0)
MCV: 88.8 fL (ref 80.0–100.0)
Platelets: 196 10*3/uL (ref 150–400)
RBC: 4.81 MIL/uL (ref 4.22–5.81)
RDW: 13.1 % (ref 11.5–15.5)
WBC: 15.5 10*3/uL — ABNORMAL HIGH (ref 4.0–10.5)
nRBC: 0 % (ref 0.0–0.2)

## 2021-08-10 LAB — ECHOCARDIOGRAM COMPLETE
Area-P 1/2: 2.11 cm2
Height: 70 in
Weight: 2645.52 oz

## 2021-08-10 LAB — PHOSPHORUS: Phosphorus: 4.3 mg/dL (ref 2.5–4.6)

## 2021-08-10 LAB — COMPREHENSIVE METABOLIC PANEL
ALT: 62 U/L — ABNORMAL HIGH (ref 0–44)
AST: 61 U/L — ABNORMAL HIGH (ref 15–41)
Albumin: 3.9 g/dL (ref 3.5–5.0)
Alkaline Phosphatase: 71 U/L (ref 38–126)
Anion gap: 18 — ABNORMAL HIGH (ref 5–15)
BUN: 17 mg/dL (ref 6–20)
CO2: 19 mmol/L — ABNORMAL LOW (ref 22–32)
Calcium: 8.7 mg/dL — ABNORMAL LOW (ref 8.9–10.3)
Chloride: 106 mmol/L (ref 98–111)
Creatinine, Ser: 1.26 mg/dL — ABNORMAL HIGH (ref 0.61–1.24)
GFR, Estimated: >60 mL/min (ref >60)
Glucose, Bld: 85 mg/dL (ref 70–99)
Potassium: 4.5 mmol/L (ref 3.5–5.1)
Sodium: 143 mmol/L (ref 135–145)
Total Bilirubin: 1.1 mg/dL (ref 0.3–1.2)
Total Protein: 6.4 g/dL — ABNORMAL LOW (ref 6.5–8.1)

## 2021-08-10 LAB — TRIGLYCERIDES: Triglycerides: 210 mg/dL — ABNORMAL HIGH (ref <150)

## 2021-08-10 LAB — MAGNESIUM: Magnesium: 1.7 mg/dL (ref 1.7–2.4)

## 2021-08-10 LAB — MRSA NEXT GEN BY PCR, NASAL: MRSA by PCR Next Gen: NOT DETECTED

## 2021-08-10 MED ORDER — LEVETIRACETAM 500 MG/5ML IV SOLN
4000.0000 mg | Freq: Once | INTRAVENOUS | Status: AC
  Administered 2021-08-10: 4000 mg via INTRAVENOUS
  Filled 2021-08-10: qty 40

## 2021-08-10 MED ORDER — VALPROATE SODIUM 100 MG/ML IV SOLN
500.0000 mg | Freq: Two times a day (BID) | INTRAVENOUS | Status: DC
  Administered 2021-08-10 – 2021-08-11 (×2): 500 mg via INTRAVENOUS
  Filled 2021-08-10 (×3): qty 5

## 2021-08-10 MED ORDER — VALPROATE SODIUM 100 MG/ML IV SOLN
1500.0000 mg | Freq: Once | INTRAVENOUS | Status: AC
  Administered 2021-08-10: 1500 mg via INTRAVENOUS
  Filled 2021-08-10: qty 15

## 2021-08-10 MED ORDER — PERFLUTREN LIPID MICROSPHERE
1.0000 mL | INTRAVENOUS | Status: AC | PRN
  Administered 2021-08-10: 3 mL via INTRAVENOUS

## 2021-08-10 MED ORDER — ORAL CARE MOUTH RINSE: 15.0000 mL | OROMUCOSAL | Status: DC | PRN

## 2021-08-10 MED ORDER — LEVETIRACETAM IN NACL 1000 MG/100ML IV SOLN
1000.0000 mg | Freq: Two times a day (BID) | INTRAVENOUS | Status: DC
  Administered 2021-08-10 – 2021-08-11 (×3): 1000 mg via INTRAVENOUS
  Filled 2021-08-10 (×3): qty 100

## 2021-08-10 MED ORDER — CHLORHEXIDINE GLUCONATE CLOTH 2 % EX PADS
6.0000 | MEDICATED_PAD | Freq: Every day | CUTANEOUS | Status: DC
Start: 2021-08-10 — End: 2021-08-11
  Administered 2021-08-10 (×2): 6 via TOPICAL

## 2021-08-10 MED ORDER — ORAL CARE MOUTH RINSE
15.0000 mL | OROMUCOSAL | Status: DC
  Administered 2021-08-10 – 2021-08-11 (×17): 15 mL via OROMUCOSAL

## 2021-08-10 NOTE — Progress Notes (Signed)
Nutrition Consult  Received MD Consult for TF initiation and management. Patient has a poor prognosis and family has decided to transition to comfort care. Plans for one way extubation no later than Friday per Palliative Care team notes.  Patient is currently receiving Vital High Protein at 40 ml/h with Prosource TF 45 ml BID via OG tube to provide 1040 kcal, 106 gm protein, 803 ml free water daily.  When RD visited patient's room, family present questioned RD as to why patient is receiving nutrition if he was "basically dead." Explained that nutrition is part of his care and it was initiated prior to knowing his prognosis. They wish to speak with the RN and MD. RN and MD notified. Given plans to withdraw care within the next 24-48 hours, will continue current feeding regimen. It would be reasonable to stop TF if family desires.   [name] RD, LDN, CNSC Please refer to Amion for contact information.

## 2021-08-10 NOTE — Progress Notes (Signed)
Neurology Progress Note   S:// Patient seen and examined this morning. Continues to have frequent eye twitching and myoclonic jerking. EEG reviewed at bedside also shows suppressed EEG with myoclonic bursts.   O:// Current vital signs: BP 118/84   Pulse 82   Temp 98.4 F (36.9 C)   Resp 13   Ht 5\' 10"  (1.778 m)   Wt 75 kg   SpO2 100%   BMI 23.72 kg/m  Vital signs in last 24 hours: Temp:  [95.9 F (35.5 C)-100.6 F (38.1 C)] 98.4 F (36.9 C) (07/12 0800) Pulse Rate:  [61-117] 82 (07/12 0800) Resp:  [9-24] 13 (07/12 0800) BP: (106-163)/(71-108) 118/84 (07/12 0800) SpO2:  [95 %-100 %] 100 % (07/12 0800) FiO2 (%):  [30 %-100 %] 30 % (07/12 0747) Weight:  [75 kg] 75 kg (07/11 2005) General: Sedated with propofol and fentanyl, intubated HEENT: Normocephalic atraumatic CVS: Regular rhythm Respiratory: Vented Abdomen nondistended nontender Extremities warm well perfused Neurologic exam Sedated with propofol and fentanyl, intubated Propofol held for exam Continues to have upward eye twitching and whole body twitching every few seconds, more frequent with stimulus.. Does not follow commands No response to noxious stimulation Cranial nerves: Pupils equal round reactive sluggishly, corneals absent, breathing somewhat above the ventilator, grossly symmetric facies. Motor examination with no spontaneous movement other than twitching which is noted above.  No response to noxious stimulation Sensation: As above   Medications  Current Facility-Administered Medications:    0.9 %  sodium chloride infusion, 250 mL, Intravenous, Continuous, 03-16-1982, DO, Last Rate: 10 mL/hr at 08/10/21 0700, Infusion Verify at 08/10/21 0700   acetaminophen (TYLENOL) tablet 650 mg, 650 mg, Per Tube, Q4H, 650 mg at 08/10/21 0515 **OR** acetaminophen (TYLENOL) 160 MG/5ML solution 650 mg, 650 mg, Per Tube, Q4H, 650 mg at 08/10/21 0930 **OR** acetaminophen (TYLENOL) suppository 650 mg, 650 mg,  Rectal, Q4H, 10/11/21, DO, 650 mg at 08/09/21 2259   [START ON August 20, 2021] acetaminophen (TYLENOL) tablet 650 mg, 650 mg, Per Tube, Q4H PRN **OR** [START ON 2021/08/20] acetaminophen (TYLENOL) 160 MG/5ML solution 650 mg, 650 mg, Per Tube, Q4H PRN **OR** [START ON 2021-08-20] acetaminophen (TYLENOL) suppository 650 mg, 650 mg, Rectal, Q4H PRN, 08/13/2021, Laura P, DO   albuterol (PROVENTIL) (2.5 MG/3ML) 0.083% nebulizer solution 2.5 mg, 2.5 mg, Nebulization, Q2H PRN, Chestine Spore, Laura P, DO   busPIRone (BUSPAR) tablet 30 mg, 30 mg, Per Tube, Q8H PRN **OR** busPIRone (BUSPAR) tablet 30 mg, 30 mg, Per Tube, Q8H PRN, 09-09-1981 P, DO   cefTRIAXone (ROCEPHIN) 2 g in sodium chloride 0.9 % 100 mL IVPB, 2 g, Intravenous, Q24H, Karie Fetch, MD, Stopped at 08/10/21 0015   Chlorhexidine Gluconate Cloth 2 % PADS 6 each, 6 each, Topical, Daily, 10/11/21, DO, 6 each at 08/10/21 0013   docusate (COLACE) 50 MG/5ML liquid 100 mg, 100 mg, Per Tube, BID PRN, 10/11/21, DO   docusate (COLACE) 50 MG/5ML liquid 100 mg, 100 mg, Per Tube, BID, Steffanie Dunn P, DO, 100 mg at 08/10/21 0930   feeding supplement (PROSource TF) liquid 45 mL, 45 mL, Per Tube, BID, Clark, Laura P, DO, 45 mL at 08/10/21 0929   feeding supplement (VITAL HIGH PROTEIN) liquid 1,000 mL, 1,000 mL, Per Tube, Q24H, Clark, Laura P, DO, 1,000 mL at 08/10/21 0931   heparin injection 5,000 Units, 5,000 Units, Subcutaneous, Q8H, 10/11/21, DO, 5,000 Units at 08/10/21 0516   insulin aspart (novoLOG) injection 1-3 Units, 1-3 Units,  Subcutaneous, Q4H, Julian Hy, DO, 1 Units at 08/09/21 2258   levETIRAcetam (KEPPRA) IVPB 1000 mg/100 mL premix, 1,000 mg, Intravenous, Q12H, Julian Hy, DO, Last Rate: 400 mL/hr at 08/10/21 0943, 1,000 mg at 08/10/21 0943   magnesium sulfate IVPB 2 g 50 mL, 2 g, Intravenous, Once PRN, Noemi Chapel P, DO   norepinephrine (LEVOPHED) 4mg  in 214mL (0.016 mg/mL) premix infusion, 2-10 mcg/min, Intravenous, Titrated,  Carlis Abbott, Laura P, DO   ondansetron [facility]) injection 4 mg, 4 mg, Intravenous, Q6H PRN, Julian Hy, DO   Oral care mouth rinse, 15 mL, Mouth Rinse, Q2H, Clark, Laura P, DO, 15 mL at 08/10/21 0934   Oral care mouth rinse, 15 mL, Mouth Rinse, PRN, Noemi Chapel P, DO   pantoprazole sodium (PROTONIX) 40 mg/20 mL oral suspension 40 mg, 40 mg, Per Tube, Daily, Noemi Chapel P, DO, 40 mg at 08/10/21 0930   polyethylene glycol (MIRALAX / GLYCOLAX) packet 17 g, 17 g, Per Tube, Daily PRN, Noemi Chapel P, DO   polyethylene glycol (MIRALAX / GLYCOLAX) packet 17 g, 17 g, Per Tube, Daily, Clark, Laura P, DO, 17 g at 08/10/21 0929   propofol (DIPRIVAN) 1000 MG/100ML infusion, 5-80 mcg/kg/min, Intravenous, Continuous, Sherwood Gambler, MD, Last Rate: 36 mL/hr at 08/10/21 0920, 80 mcg/kg/min at 08/10/21 0920   valproate (DEPACON) 500 mg in dextrose 5 % 50 mL IVPB, 500 mg, Intravenous, Q12H, Amie Portland, MD Labs CBC    Component Value Date/Time   WBC 15.5 (H) 08/10/2021 0703   RBC 4.81 08/10/2021 0703   HGB 15.3 08/10/2021 0703   HCT 42.7 08/10/2021 0703   PLT 196 08/10/2021 0703   MCV 88.8 08/10/2021 0703   MCH 31.8 08/10/2021 0703   MCHC 35.8 08/10/2021 0703   RDW 13.1 08/10/2021 0703   LYMPHSABS 1.7 08/09/2021 1806   MONOABS 0.2 08/09/2021 1806   EOSABS 0.0 08/09/2021 1806   BASOSABS 0.0 08/09/2021 1806    CMP     Component Value Date/Time   NA 143 08/10/2021 0703   K 4.5 08/10/2021 0703   CL 106 08/10/2021 0703   CO2 19 (L) 08/10/2021 0703   GLUCOSE 85 08/10/2021 0703   BUN 17 08/10/2021 0703   CREATININE 1.26 (H) 08/10/2021 0703   CALCIUM 8.7 (L) 08/10/2021 0703   PROT 6.4 (L) 08/10/2021 0703   ALBUMIN 3.9 08/10/2021 0703   AST 61 (H) 08/10/2021 0703   ALT 62 (H) 08/10/2021 0703   ALKPHOS 71 08/10/2021 0703   BILITOT 1.1 08/10/2021 0703   GFRNONAA >60 08/10/2021 0703  UDS positive for cocaine, THC  Imaging I have reviewed images in epic and the results pertinent to this  consultation are: Initial CT head read as normal but there might have some loss of sulci and concern for early CT change from anoxic brain injury Repeat head CT with some sulcal effacement and decrease in the size of ventricles raising concern for anoxic brain injury.  EEG with myoclonic seizures every 15 to 30 seconds as well as profound diffuse encephalopathy likely suggestive of anoxic brain injury.  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 -Post anoxic myoclonus/myoclonic status epilepticus -Concern for poor prognosis from a neurological meaningful recovery standpoint given imaging and clinical exam findings which are poor.  Recommendations: -Increase Keppra to 1000 twice daily -Valproate load followed by valproate 500 twice daily -Can go up on propofol as tolerated by blood pressure and triglycerides-currently on 50-go up to 80. -Supportive management  per primary team as you are. -Prognosis from a neurologically meaningful recovery standpoint appears to be poor given extremely poor exam with minimal brainstem response and no higher cortical function elicitable at this time but he has been initiated on TTM and for prognostication we will have to be prolonged-that said, his CT scan already looks like it is showing possible cerebral edema as evidenced by decrease in ventricle size is in comparison to scan from 12 hours ago raising suspicion for severe anoxic brain injury already settling in. -Continue to have GOC conversations and consider PMT involvement early if deemed appropriate  Discussed with the ICU team-Dr. Katrinka Blazing. Discussed with the patient's mother who was at bedside and is visibly extremely upset.  Also discussed with patient's aunt, who requested to be present while discussions are being done.  -- Milon Dikes, MD Neurologist [facility] Pager: [phone]  CRITICAL CARE ATTESTATION Performed by: Milon Dikes, MD Total critical care time: 37 minutes Critical care time was exclusive of separately billable procedures and treating other patients and/or supervising APPs/Residents/Students Critical care was necessary to treat or prevent imminent or life-threatening deterioration due to anoxic brain injury  This patient is critically ill and at significant risk for neurological worsening and/or death and care requires constant monitoring. Critical care was time spent personally by me on the following activities: development of treatment plan with patient and/or surrogate as well as nursing, discussions with consultants, evaluation of patient's response to treatment, examination of patient, obtaining history from patient or surrogate, ordering and performing treatments and interventions, ordering and review of laboratory studies, ordering and review of radiographic studies, pulse oximetry, re-evaluation of patient's condition, participation in multidisciplinary rounds and medical decision making of high complexity in the care of this patient.

## 2021-08-10 NOTE — Progress Notes (Signed)
EEG complete - results pending 

## 2021-08-10 NOTE — Progress Notes (Signed)
LTM EEG hooked up and running - no initial skin breakdown - push button tested - neuro notified. [facility] monitoring.  

## 2021-08-10 NOTE — Consult Note (Signed)
Consultation Note Date: 08/10/2021   Patient Name: Frank Duran  DOB: 1995/03/13  MRN: [id number]  Age / Sex: 26 y.o., male  PCP: Susy Frizzle, MD Referring Physician: Candee Furbish, MD  Reason for Consultation: Establishing goals of care  HPI/Patient Profile: 26 y.o. male  with past medical history of IVDU, ADHD, and oppositional defiant disorder admitted on 08/09/2021 with cardiac arrest. EEG reviewed by Neurology and c/w profound diffuse encephalopathy. Early CT Head on 7/11 with signs of possible early loss of gray-white matter boundary. Head CT 7/12 worse. PMT consulted to discuss [place].   Clinical Assessment and Goals of Care: I have reviewed medical records including EPIC notes, labs and imaging, received report from Dr. Tamala Julian and RN, assessed the patient and then met with patient's family to include mother, father, uncle, and mother of his child  to discuss diagnosis prognosis, GOC, EOL wishes, disposition and options.  I introduced Palliative Medicine as specialized medical care for people living with serious illness. It focuses on providing relief from the symptoms and stress of a serious illness. The goal is to improve quality of life for both the patient and the family.  We discussed a brief life review of the patient. Family describes patient as having a huge heart. They tell me about how sociable he is - could make friends with anyone, very charming.    We discussed patient's current illness and what it means in the larger context of patient's on-going co-morbidities.  Natural disease trajectory and expectations at EOL were discussed. We discussed concern about anoxic brain injury.  We reviewed concern that patient would not return to his independent baseline.  I attempted to elicit values and goals of care important to the patient.  All family agrees patient would not want to live dependent on medical equipment.  The  difference between aggressive medical intervention and comfort care was considered in light of the patient's goals of care.  Family all agrees they would like to shift to comfort care.  We discussed giving them a day or 2 to allow other family's to visit prior to freeing him from the ventilator.  Mother and says she does not want to prolong care past Friday.  We reviewed CODE STATUS and discussed changing to DNR.  All family agrees  Questions and concerns were addressed. The family was encouraged to call with questions or concerns.    Primary Decision Maker NEXT OF KIN -mother and father    [place] for one-way extubation in coming days; allowing family time to visit -grandmother is planning to go home -Patient's mother does not want to continue current measures past Friday, possible extubation Thursday -Change CODE STATUS to DNR  Code Status/Advance Care Planning: DNR  Discharge Planning: Anticipated Hospital Death      Primary Diagnoses: Present on Admission:  Cardiac arrest [facility])   I have reviewed the medical record, interviewed the patient and family, and examined the patient. The following aspects are pertinent.  Past Medical History:  Diagnosis Date   Abscess    ADHD    IVDU (intravenous drug user)    Oppositional defiant disorder    Tobacco abuse    Social History   Socioeconomic History   Marital status: Single    Spouse name: Not on file   Number of children: Not on file   Years of education: Not on file   Highest education level: Not on file  Occupational History   Not  on file  Tobacco Use   Smoking status: Every Day    Packs/day: 0.50    Years: 5.00    Total pack years: 2.50    Types: Cigarettes   Smokeless tobacco: Current  Substance and Sexual Activity   Alcohol use: Not on file   Drug use: Not on file    Comment: polysub, heroin   Sexual activity: Not on file  Other Topics Concern   Not on file  Social History  Narrative   Not on file   Social Determinants of Health   Financial Resource Strain: Not on file  Food Insecurity: Not on file  Transportation Needs: Not on file  Physical Activity: Not on file  Stress: Not on file  Social Connections: Not on file   Family History  Problem Relation Age of Onset   COPD Mother    Hyperlipidemia Father    Heart disease Paternal Grandmother    Heart disease Paternal Grandfather    Scheduled Meds:  acetaminophen  650 mg Per Tube Q4H   Or   acetaminophen (TYLENOL) oral liquid 160 mg/5 mL  650 mg Per Tube Q4H   Or   acetaminophen  650 mg Rectal Q4H   Chlorhexidine Gluconate Cloth  6 each Topical Daily   docusate  100 mg Per Tube BID   feeding supplement (PROSource TF)  45 mL Per Tube BID   feeding supplement (VITAL HIGH PROTEIN)  1,000 mL Per Tube Q24H   heparin  5,000 Units Subcutaneous Q8H   insulin aspart  1-3 Units Subcutaneous Q4H   mouth rinse  15 mL Mouth Rinse Q2H   pantoprazole sodium  40 mg Per Tube Daily   polyethylene glycol  17 g Per Tube Daily   Continuous Infusions:  sodium chloride 10 mL/hr at 08/10/21 1200   cefTRIAXone (ROCEPHIN)  IV Stopped (08/10/21 0015)   levETIRAcetam Stopped (08/10/21 1000)   magnesium sulfate     norepinephrine (LEVOPHED) Adult infusion     propofol (DIPRIVAN) infusion 80 mcg/kg/min (08/10/21 1232)   valproate sodium     PRN Meds:.[START ON 26-Aug-2021] acetaminophen **OR** [START ON 08/26/21] acetaminophen (TYLENOL) oral liquid 160 mg/5 mL **OR** [START ON 08/26/2021] acetaminophen, albuterol, busPIRone **OR** busPIRone, docusate, magnesium sulfate, ondansetron (ZOFRAN) IV, mouth rinse, polyethylene glycol No Known Allergies Review of Systems  Unable to perform ROS: Intubated    Physical Exam Constitutional:      Comments: unresponsive  Pulmonary:     Comments: intubated    Vital Signs: BP 101/67   Pulse 92   Temp 98.2 F (36.8 C)   Resp 14   Ht 5' 10" (1.778 m)   Wt 75 kg   SpO2 100%    BMI 23.72 kg/m  Pain Scale: CPOT   Pain Score:  (unresponsive)   SpO2: SpO2: 100 % O2 Device:SpO2: 100 % O2 Flow Rate: .   IO: Intake/output summary:  Intake/Output Summary (Last 24 hours) at 08/10/2021 1358 Last data filed at 08/10/2021 1200 Gross per 24 hour  Intake 1631.26 ml  Output 875 ml  Net 756.26 ml    LBM: Last BM Date :  (pta) Baseline Weight: Weight: 75 kg Most recent weight: Weight: 75 kg     Palliative Assessment/Data: PPS 10%     *Please note that this is a verbal dictation therefore any spelling or grammatical errors are due to the "[place]" system interpretation.   Juel Burrow, DNP, AGNP-C Palliative Medicine Team [phone] Pager: [phone]

## 2021-08-10 NOTE — TOC Progression Note (Signed)
Transition of Care [facility]) - Initial/Assessment Note    Patient Details  Name: Frank Duran MRN: [id number] Date of Birth: Jun 28, 1995  Transition of Care [facility]) CM/SW Contact:    Ralene Bathe, LCSWA Phone Number: 08/10/2021, 2:27 PM  Clinical Narrative:                  Transition of Care Department [facility]) has reviewed patient and no TOC needs have been identified at this time. We will continue to monitor patient advancement through interdisciplinary progression rounds. If new patient transition needs arise, please place a TOC consult.          Patient Goals and CMS Choice        Expected Discharge Plan and Services                                                Prior Living Arrangements/Services                       Activities of Daily Living      Permission Sought/Granted                  Emotional Assessment              Admission diagnosis:  Cardiac arrest [facility]) [I46.9] Cardiopulmonary arrest (HCC) [I46.9] Overdose of undetermined intent, initial encounter [T50.904A] Patient Active Problem List   Diagnosis Date Noted   Cardiac arrest (HCC) 08/09/2021   PCP:  Donita Brooks, MD Pharmacy:   [id number] [place], [place] - [address] [place] [address] Phone: [phone] Fax: [phone]     Social Determinants of Health (SDOH) Interventions    Readmission Risk Interventions     No data to display

## 2021-08-10 NOTE — Procedures (Signed)
History: 26 year old male status post cardiac arrest  Sedation: Propofol  Technique: This EEG was acquired with electrodes placed according to the International 10-20 electrode system (including Fp1, Fp2, F3, F4, C3, C4, P3, P4, O1, O2, T3, T4, T5, T6, A1, A2, Fz, Cz, Pz). The following electrodes were missing or displaced: none.   Background: The background is diffusely attenuated with the exception of occasional bursts of epileptiform activity associated with generalized myoclonus.  These are not periodic or frequent and are stimulus induced  Photic stimulation: Physiologic driving is now performed  EEG Abnormalities: 1) burst suppression EEG with epileptiform burst associated with myoclonus  Clinical Interpretation: This EEG is consistent with a profound generalized cerebral dysfunction as can be seen after anoxic brain injury.  Ritta Slot, MD [facility] [phone]  If 7pm- 7am, please page neurology on call as listed in AMION.

## 2021-08-10 NOTE — Procedures (Signed)
Patient Name: Frank Duran  MRN: [id number]  Epilepsy Attending: Charlsie Quest  Referring Physician/Provider: Rejeana Brock, MD  Duration: 08/10/2021 0126 to 7846  Patient history:  26 year old male status post cardiac arrest.  EEG to evaluate for seizure  Level of alertness:  comatose  AEDs during EEG study: VPA, Propofol  Technical aspects: This EEG study was done with scalp electrodes positioned according to the 10-20 International system of electrode placement. Electrical activity was acquired at a sampling rate of 500Hz  and reviewed with a high frequency filter of 70Hz  and a low frequency filter of 1Hz . EEG data were recorded continuously and digitally stored.   Description: Patient was noted to have episodes of sudden brief eye-opening. Concomitant EEG showed generalized epileptiform bursts lasting 1 to 14 seconds consistent with myoclonic seizure.  In between seizures, EEG showed generalized background suppression lasting 15 to 30 seconds, not reactive to stimulation. Hyperventilation and photic stimulation were not performed.     ABNORMALITY -Myoclonic seizure, generalized -Burst suppression with highly epileptiform bursts, generalized  IMPRESSION: This study showed myoclonic seizures every 15 to 30 seconds as well as profound diffuse encephalopathy.  In the setting of cardiac arrest, this is most likely suggestive of anoxic/hypoxic brain injury.  Jamiya Nims 

## 2021-08-10 NOTE — Progress Notes (Signed)
NAME:  Frank Duran, MRN:  [id number], DOB:  12-15-1995, LOS: 1 ADMISSION DATE:  08/09/2021, CONSULTATION DATE: 08/09/2021 REFERRING MD:  Jamal Maes, CHIEF COMPLAINT: Cardiac arrest  History of Present Illness:  Frank Duran is a 26 y/o gentleman with a history of drug abuse who presented after witnessed cardiac arrest this morning.  Per his aunt he had gotten out of prison this morning and was planning to go to a drug rehab program in [place] starting tomorrow.  His cardiac arrest in the car was witnessed by his cousins.  When they got to her house they started bystander CPR until EMS got there.  He did not respond to Narcan in the field.  He received 2 rounds of epinephrine, due to defibrillation shocks, and 5 minutes of CPR.  He was intubated in the field with a King airway, switched from endotracheal tube in the ED.   Pertinent  Medical History  Drug abuse ADHD Oppositional defiant disorder  Significant Hospital Events: Including procedures, antibiotic start and stop dates in addition to other pertinent events   7/11 admission, started on EEG 7/12 Repeat CT Head  Interim History / Subjective:  Frank Duran is accompanied by his mother and aunt at bedside this morning. He is intubated and sedated on Prop and Fent, eyes continue to flicker with upward beats.   Objective   Blood pressure 118/84, pulse 82, temperature 98.4 F (36.9 C), resp. rate 13, height 5\' 10"  (1.778 m), weight 75 kg, SpO2 100 %.    Vent Mode: PRVC FiO2 (%):  [30 %-100 %] 30 % Set Rate:  [16 bmp-24 bmp] 16 bmp Vt Set:  [500 mL-580 mL] 580 mL PEEP:  [5 cmH20] 5 cmH20 Plateau Pressure:  [14 cmH20-15 cmH20] 15 cmH20   Intake/Output Summary (Last 24 hours) at 08/10/2021 0915 Last data filed at 08/10/2021 0830 Gross per 24 hour  Intake 1094.36 ml  Output 875 ml  Net 219.36 ml   Filed Weights   08/09/21 1840 08/09/21 2005  Weight: 75 kg 75 kg    Examination: General: Critically ill-appearing man  lying in bed no acute distress, sedated on propofol, intubated  HENT: [place]/AT, old scars on his head, endotracheal tube in place Lungs: CTAB, synchronous with the vent Cardiovascular: S1 S2, regular rate and rhythm Abdomen: Soft, nontender Extremities: No peripheral edema Neuro: Intact pupillary reflexes, no corneal reflexes. Intact gag reflex.  Upward gaze deviation.  Derm: Warm, dry  ABG    Component Value Date/Time   PHART 7.481 (H) 08/10/2021 0523   PCO2ART 32.4 08/10/2021 0523   PO2ART 170 (H) 08/10/2021 0523   HCO3 25.0 08/10/2021 0523   TCO2 26 08/10/2021 0523   ACIDBASEDEF 10.0 (H) 08/09/2021 2211   O2SAT 100 08/10/2021 0523    Resolved Hospital Problem list     Assessment & Plan:   C/f Anoxic brain injury EEG reviewed by Neurology and c/w profound diffuse encephalopathy. Early CT Head on 7/11 with signs of possible early loss of gray-white matter boundary, and repeating CT Head 7/12. Neuro following and appreciate recs. Complicated by frequent myoclonic activity.  - Keppra and Depakote per Neurology - Repeat CT Head 7/12 AM - Continuous EEG - Neuro following  Acute respiratory failure Initial hypercapnia resolved and pt on PRVC now with 30% FiO2 and PEEP 5. Certainly concern for inability to protect airway secondary to above.  - ABGs  - Continue PRVC - VAP prevention protocol - PAD protocol for sedation -Empiric antibiotics for aspiration  AKI Most likely pre-renal vs ATN 2/2 poor perfusion with unknown amount of time down from cardiac arrest.  Cr at 1.57 on presentation improving now to 1.26 after IVF hydration. - Strict I's/O - Renally dose meds and avoid nephrotoxic meds - Continue to monitor   Cardiac arrest w/u Presumed related to recent drug use but will seek to rule out cardiac etiologies with Echo.  -supportive care, head of bed greater than 30 degrees -monitor on tele -monitor & optimize electrolytes -TTM normothermia -Echocardiogram   Elevated  transaminases Likely 2/2 to cardiac arrest.  High risk for shock liver. AST and ALT mildly downtrended on 7/12 to 61, 62 respectively.  - Monitor - Avoid hepatotoxic medications  Hyperglycemia On presentation was 228.  - Check A1c - Sliding scale insulin as needed   Best Practice (right click and "Reselect all SmartList Selections" daily)   Diet/type: tubefeeds DVT prophylaxis: prophylactic heparin  GI prophylaxis: PPI Lines: N/A Foley:  Yes, and it is still needed Code Status:  full code Last date of multidisciplinary goals of care discussion [ ]   Labs   CBC: Recent Labs  Lab 08/09/21 1806 08/09/21 2049 08/09/21 2140 08/09/21 2211 08/10/21 0045 08/10/21 0523 08/10/21 0703  WBC 6.2  --  16.6*  --   --   --  15.5*  NEUTROABS 4.2  --   --   --   --   --   --   HGB 14.7   < > 16.2 16.0 13.9 13.6 15.3  HCT 41.0   < > 46.5 47.0 41.0 40.0 42.7  MCV 89.3  --  89.9  --   --   --  88.8  PLT 272  --  283  --   --   --  196   < > = values in this interval not displayed.     Basic Metabolic Panel: Recent Labs  Lab 08/09/21 1806 08/09/21 2049 08/09/21 2211 08/10/21 0045 08/10/21 0523 08/10/21 0703  NA 138 139 141 142 142 143  K 3.8 4.5 4.3 3.8 3.7 4.5  CL 105  --   --   --   --  106  CO2 14*  --   --   --   --  19*  GLUCOSE 228*  --   --   --   --  85  BUN 15  --   --   --   --  17  CREATININE 1.57*  --   --   --   --  1.26*  CALCIUM 8.4*  --   --   --   --  8.7*  MG  --   --   --   --   --  1.7  PHOS  --   --   --   --   --  4.3    GFR: Estimated Creatinine Clearance: 91.7 mL/min (A) (by C-G formula based on SCr of 1.26 mg/dL (H)). Recent Labs  Lab 08/09/21 1806 08/09/21 2140 08/10/21 0703  WBC 6.2 16.6* 15.5*     Liver Function Tests: Recent Labs  Lab 08/09/21 1806 08/10/21 0703  AST 68* 61*  ALT 70* 62*  ALKPHOS 77 71  BILITOT 1.1 1.1  PROT 6.8 6.4*  ALBUMIN 4.3 3.9    No results for input(s): "LIPASE", "AMYLASE" in the last 168 hours. No  results for input(s): "AMMONIA" in the last 168 hours.  ABG    Component Value Date/Time   PHART 7.481 (H) 08/10/2021  0523   PCO2ART 32.4 08/10/2021 0523   PO2ART 170 (H) 08/10/2021 0523   HCO3 25.0 08/10/2021 0523   TCO2 26 08/10/2021 0523   ACIDBASEDEF 10.0 (H) 08/09/2021 2211   O2SAT 100 08/10/2021 0523     Coagulation Profile: No results for input(s): "INR", "PROTIME" in the last 168 hours.  Cardiac Enzymes: No results for input(s): "CKTOTAL", "CKMB", "CKMBINDEX", "TROPONINI" in the last 168 hours.  HbA1C: Hgb A1c MFr Bld  Date/Time Value Ref Range Status  08/09/2021 09:40 PM 4.8 4.8 - 5.6 % Final    Comment:    (NOTE) Pre diabetes:          5.7%-6.4%  Diabetes:              >6.4%  Glycemic control for   <7.0% adults with diabetes     CBG: Recent Labs  Lab 08/09/21 2243 08/10/21 0007 08/10/21 0324 08/10/21 0739  GLUCAP 141* 109* 75 86    Review of Systems:   Unable   Past Medical History:  He,  has a past medical history of Abscess, ADHD, IVDU (intravenous drug user), Oppositional defiant disorder, and Tobacco abuse.   Surgical History:   Past Surgical History:  Procedure Laterality Date   ABCESS DRAINAGE       Social History:   reports that he has been smoking cigarettes. He has a 2.50 pack-year smoking history. He uses smokeless tobacco.   Family History:  His family history includes COPD in his mother; Heart disease in his paternal grandfather and paternal grandmother; Hyperlipidemia in his father.   Allergies No Known Allergies   Home Medications  Prior to Admission medications   Not on File    Signed,  Baldwin Jamaica, MS4

## 2021-08-10 NOTE — Progress Notes (Signed)
Patient was transported to CT & back to room 3M10 on the ventilator with no problems.

## 2021-08-10 NOTE — Progress Notes (Signed)
Echocardiogram 2D Echocardiogram has been performed.  Warren Lacy Vincente Asbridge RDCS 08/10/2021, 10:32 AM

## 2021-08-10 NOTE — Progress Notes (Signed)
eLink Physician-Brief Progress Note Patient Name: [place] DOB: 08-Feb-1995 MRN: [id number]   Date of Service  08/10/2021  HPI/Events of Note  ABG on 40%/PRVC 24/TV 580/P 5 = 7.533/27.8/184/23.4.  eICU Interventions  Plan: PRVC rate decreased to 16 by RT and I agree with this change. Repeat ABG at 5 AM.      Intervention Category Major Interventions: Respiratory failure - evaluation and management;Acid-Base disturbance - evaluation and management  Carley Glendenning Eugene 08/10/2021, 1:03 AM

## 2021-08-11 ENCOUNTER — Inpatient Hospital Stay (HOSPITAL_COMMUNITY): Payer: Medicaid Other

## 2021-08-11 DIAGNOSIS — Z515 Encounter for palliative care: Secondary | ICD-10-CM

## 2021-08-11 DIAGNOSIS — G931 Anoxic brain damage, not elsewhere classified: Secondary | ICD-10-CM

## 2021-08-11 DIAGNOSIS — Z7189 Other specified counseling: Secondary | ICD-10-CM

## 2021-08-11 DIAGNOSIS — I469 Cardiac arrest, cause unspecified: Secondary | ICD-10-CM

## 2021-08-11 DIAGNOSIS — Z66 Do not resuscitate: Secondary | ICD-10-CM

## 2021-08-11 LAB — COMPREHENSIVE METABOLIC PANEL
ALT: 47 U/L — ABNORMAL HIGH (ref 0–44)
AST: 61 U/L — ABNORMAL HIGH (ref 15–41)
Albumin: 3.7 g/dL (ref 3.5–5.0)
Alkaline Phosphatase: 76 U/L (ref 38–126)
Anion gap: 12 (ref 5–15)
BUN: 18 mg/dL (ref 6–20)
CO2: 23 mmol/L (ref 22–32)
Calcium: 8.6 mg/dL — ABNORMAL LOW (ref 8.9–10.3)
Chloride: 104 mmol/L (ref 98–111)
Creatinine, Ser: 1.07 mg/dL (ref 0.61–1.24)
GFR, Estimated: >60 mL/min (ref >60)
Glucose, Bld: 134 mg/dL — ABNORMAL HIGH (ref 70–99)
Potassium: 3.8 mmol/L (ref 3.5–5.1)
Sodium: 139 mmol/L (ref 135–145)
Total Bilirubin: 1 mg/dL (ref 0.3–1.2)

## 2021-08-11 LAB — PHOSPHORUS: Phosphorus: 2.6 mg/dL (ref 2.5–4.6)

## 2021-08-11 LAB — MISC LABCORP TEST (SEND OUT): Labcorp test code: 721575

## 2021-08-11 LAB — GLUCOSE, CAPILLARY
Glucose-Capillary: 171 mg/dL — ABNORMAL HIGH (ref 70–99)
Glucose-Capillary: 211 mg/dL — ABNORMAL HIGH (ref 70–99)

## 2021-08-11 LAB — MAGNESIUM: Magnesium: 2.1 mg/dL (ref 1.7–2.4)

## 2021-08-11 MED ORDER — HALOPERIDOL 1 MG PO TABS: 2.0000 mg | ORAL_TABLET | ORAL | Status: DC | PRN

## 2021-08-11 MED ORDER — LORAZEPAM 2 MG/ML IJ SOLN
2.0000 mg | INTRAMUSCULAR | Status: AC
Start: 2021-08-11 — End: 2021-08-11
  Administered 2021-08-11: 2 mg via INTRAVENOUS

## 2021-08-11 MED ORDER — LORAZEPAM 2 MG/ML IJ SOLN
INTRAMUSCULAR | Status: AC
  Filled 2021-08-11: qty 2

## 2021-08-11 MED ORDER — LORAZEPAM 2 MG/ML IJ SOLN
2.0000 mg | INTRAMUSCULAR | Status: DC | PRN
  Administered 2021-08-11: 2 mg via INTRAVENOUS
  Filled 2021-08-11 (×2): qty 1

## 2021-08-11 MED ORDER — GLYCOPYRROLATE 0.2 MG/ML IJ SOLN
0.2000 mg | INTRAMUSCULAR | Status: DC | PRN
  Administered 2021-08-11: 0.2 mg via INTRAVENOUS
  Filled 2021-08-11: qty 1

## 2021-08-11 MED ORDER — GLYCOPYRROLATE 0.2 MG/ML IJ SOLN: 0.2000 mg | INTRAMUSCULAR | Status: DC | PRN

## 2021-08-11 MED ORDER — HALOPERIDOL LACTATE 5 MG/ML IJ SOLN: 2.0000 mg | INTRAMUSCULAR | Status: DC | PRN

## 2021-08-11 MED ORDER — LORAZEPAM 2 MG/ML IJ SOLN: 4.0000 mg | INTRAMUSCULAR | Status: DC

## 2021-08-11 MED ORDER — MORPHINE 100MG IN NS 100ML (1MG/ML) PREMIX INFUSION
2.0000 mg/h | INTRAVENOUS | Status: DC
  Administered 2021-08-11: 2 mg/h via INTRAVENOUS
  Filled 2021-08-11: qty 100

## 2021-08-11 MED ORDER — LORAZEPAM 1 MG PO TABS: 2.0000 mg | ORAL_TABLET | ORAL | Status: DC | PRN

## 2021-08-11 MED ORDER — GLYCOPYRROLATE 1 MG PO TABS: 1.0000 mg | ORAL_TABLET | ORAL | Status: DC | PRN

## 2021-08-11 MED ORDER — MORPHINE BOLUS VIA INFUSION: 2.0000 mg | INTRAVENOUS | Status: DC | PRN

## 2021-08-11 MED ORDER — HALOPERIDOL LACTATE 2 MG/ML PO CONC: 2.0000 mg | ORAL | Status: DC | PRN

## 2021-08-11 MED ORDER — MORPHINE BOLUS VIA INFUSION
5.0000 mg | INTRAVENOUS | Status: DC | PRN
  Administered 2021-08-11 (×2): 5 mg via INTRAVENOUS

## 2021-08-11 MED ORDER — POLYVINYL ALCOHOL 1.4 % OP SOLN: 1.0000 [drp] | Freq: Four times a day (QID) | OPHTHALMIC | Status: DC | PRN

## 2021-08-11 MED ORDER — LORAZEPAM 2 MG/ML PO CONC: 2.0000 mg | ORAL | Status: DC | PRN

## 2021-08-11 MED ORDER — MORPHINE BOLUS VIA INFUSION
10.0000 mg | INTRAVENOUS | Status: DC | PRN
  Administered 2021-08-11 (×4): 10 mg via INTRAVENOUS

## 2021-08-11 MED ORDER — MORPHINE SULFATE (PF) 2 MG/ML IV SOLN
2.0000 mg | INTRAVENOUS | Status: AC
  Administered 2021-08-11: 2 mg via INTRAVENOUS
  Filled 2021-08-11: qty 1

## 2021-08-11 MED ORDER — BIOTENE DRY MOUTH MT LIQD
15.0000 mL | Freq: Three times a day (TID) | OROMUCOSAL | Status: DC
Start: 2021-08-11 — End: 2021-08-11

## 2021-08-11 NOTE — Procedures (Signed)
Extubation Procedure Note  Patient Details:   Name: [place] DOB: 1995/05/05 MRN: [id number]   Airway Documentation:    Vent end date: 2021/09/02 Vent end time: 1202   Evaluation  O2 sats:  pt extubated per palliative at bedside and family wishes to withdrawal life support Complications: No apparent complications Patient did tolerate procedure well. Bilateral Breath Sounds: Clear, Diminished   No.    Jennette Kettle 02-Sep-2021, 12:56 PM

## 2021-08-11 NOTE — Progress Notes (Signed)
Performed maintenance.  All under 10.  No skin breakdown  

## 2021-08-11 NOTE — Progress Notes (Signed)
This chaplain responded to PMT NP-Shae page for a spiritual care presence with family during the Pt. compassionate extubation.   The chaplain listened reflectively as the family shared the Pt. spirit through story telling. Prayer and music was shared with the grieving family at the bedside. The chaplain is appreciative of the RN-Morgan and NP-Shae in caring for the family as they kept the Pt. comfortable.  F/U spiritual care is available as needed.  Chaplain Stephanie Acre [phone]

## 2021-08-11 NOTE — Progress Notes (Signed)
Pt transported to/from CT scan w/ no apparent respiratory complications.  Once back to ICU, per [facility] requests pt be placed on SBT to assess respiratory and brain function.  No distress noted, pt w/ adequate minute volume.  RN aware.

## 2021-08-11 NOTE — Progress Notes (Signed)
Daily Progress Note   Patient Name: Frank Duran       Date: 09/05/2021 DOB: Jun 11, 1995  Age: 26 y.o. MRN#: [id number] Attending Physician: Lorin Glass, MD Primary Care Physician: Donita Brooks, MD Admit Date: 08/09/2021  Reason for Consultation/Follow-up: Establishing goals of care  Subjective: Remains unresponsive, family at bedside  Length of Stay: 2  Current Medications: Scheduled Meds:   antiseptic oral rinse  15 mL Topical TID   LORazepam  4 mg Intravenous NOW   mouth rinse  15 mL Mouth Rinse Q2H    Continuous Infusions:  sodium chloride 10 mL/hr at 09-05-2021 0800   levETIRAcetam 1,000 mg (2021-09-05 1129)   morphine 2 mg/hr (Sep 05, 2021 1133)   valproate sodium 500 mg (05-Sep-2021 1210)    PRN Meds: acetaminophen **OR** acetaminophen (TYLENOL) oral liquid 160 mg/5 mL **OR** acetaminophen, glycopyrrolate **OR** glycopyrrolate **OR** glycopyrrolate, haloperidol **OR** haloperidol **OR** haloperidol lactate, LORazepam **OR** LORazepam **OR** LORazepam, morphine, ondansetron (ZOFRAN) IV, mouth rinse  Physical Exam Constitutional:      General: He is not in acute distress.    Appearance: He is ill-appearing.     Comments: Unresponsive, intubated  Skin:    General: Skin is warm and dry.             Vital Signs: BP (!) 165/101 (BP Location: Left Arm)   Pulse (!) 125   Temp (!) 95.7 F (35.4 C) (Core)   Resp (!) 26   Ht 5\' 10"  (1.778 m)   Wt 72 kg   SpO2 95%   BMI 22.78 kg/m  SpO2: SpO2: 95 % O2 Device: O2 Device: Room Air (pt extubated to room air per Palliative orders) O2 Flow Rate:    Intake/output summary:  Intake/Output Summary (Last 24 hours) at 2021-09-05 1535 Last data filed at 09-05-21 0800 Gross per 24 hour  Intake 1802.6 ml  Output 20 ml  Net 1782.6 ml    LBM: Last BM Date :  (pta) Baseline Weight: Weight: 75 kg Most recent weight: Weight: 72 kg       Palliative Assessment/Data: PPS 10%      Patient Active Problem List   Diagnosis Date Noted   Cardiac arrest (HCC) 08/09/2021    Palliative Care Assessment & Plan   HPI: 26 y.o. male  with past medical history of IVDU, ADHD, and oppositional defiant disorder admitted on 08/09/2021 with cardiac arrest. EEG reviewed by Neurology and c/w profound diffuse encephalopathy. Early CT Head on 7/11 with signs of possible early loss of gray-white matter boundary. Head CT 7/12 worse. PMT consulted to discuss GOC.   Assessment: Follow-up today with family members.  Initially joined family while they received news from critical care physician for worsening brain injury on imaging this morning.  We again reviewed plan to eventually free patient from mechanical ventilation and allow natural death.  All questions and concerns addressed about process.  Called back to bedside later as family reported they were ready to proceed with ventilator withdrawal.  Remained at bedside for extubation to assist with symptom management.  Patient required morphine infusion with multiple boluses as well as multiple IV pushes of Ativan for symptom management.  Remained at bedside to ensure  comfort and answer family's questions as patient approached end-of-life.  Recommendations/Plan: Patient freed from ventilator support and passed away with family at bedside  Goals of Care and Additional Recommendations: Limitations on Scope of Treatment: Full Comfort Care  Code Status: DNR  Care plan was discussed with RN, Dr. Katrinka Blazing, respiratory therapy, chaplain, multiple family members  Thank you for allowing the Palliative Medicine Team to assist in the care of this patient.   Total Time 95 minutes Prolonged Time Billed  yes       Greater than 50%  of this time was spent counseling and coordinating care related to the  above assessment and plan.  *Please note that this is a verbal dictation therefore any spelling or grammatical errors are due to the "[employer]agon [employer] One" system interpretation.  Gerlean Ren, DNP, [facility] Palliative Medicine Team Team Phone # [phone]  Pager [phone]

## 2021-08-11 NOTE — Progress Notes (Signed)
NAME:  Garon Melander, MRN:  [id number], DOB:  1995/12/28, LOS: 2 ADMISSION DATE:  08/09/2021, CONSULTATION DATE: 08/09/2021 REFERRING MD:  Jamal Maes, CHIEF COMPLAINT: Cardiac arrest  History of Present Illness:  Mr. Viernes is a 26 y/o gentleman with a history of drug abuse who presented after witnessed cardiac arrest this morning.  Per his aunt he had gotten out of prison this morning and was planning to go to a drug rehab program in [place] starting tomorrow.  His cardiac arrest in the car was witnessed by his cousins.  When they got to her house they started bystander CPR until EMS got there.  He did not respond to Narcan in the field.  He received 2 rounds of epinephrine, due to defibrillation shocks, and 5 minutes of CPR.  He was intubated in the field with a King airway, switched from endotracheal tube in the ED.   Pertinent  Medical History  Drug abuse ADHD Oppositional defiant disorder  Significant Hospital Events: Including procedures, antibiotic start and stop dates in addition to other pertinent events   7/11 admission, started on EEG 7/12 Repeat CT Head and cEEG concerning for anoxic brain injury, family moved pt to DNR  Interim History / Subjective:  Mr Prout is seen this morning looking comfortable. He is no longer having regular eye twitching nor myoclonic jerking. He did not have any acute overnight events and he was seen by palliative care yesterday.   Objective   Blood pressure (!) 153/94, pulse 62, temperature (!) 97.5 F (36.4 C), resp. rate 16, height 5\' 10"  (1.778 m), weight 72 kg, SpO2 98 %.    Vent Mode: PRVC FiO2 (%):  [30 %-40 %] 40 % Set Rate:  [16 bmp] 16 bmp Vt Set:  [580 mL] 580 mL PEEP:  [5 cmH20] 5 cmH20 Plateau Pressure:  [15 cmH20-16 cmH20] 16 cmH20   Intake/Output Summary (Last 24 hours) at 09-01-2021 0728 Last data filed at 09-01-2021 0600 Gross per 24 hour  Intake 2409.67 ml  Output 145 ml  Net 2264.67 ml    Filed Weights    08/09/21 1840 08/09/21 2005 September 01, 2021 0500  Weight: 75 kg 75 kg 72 kg    Examination: General: Critically ill-appearing man lying in bed no acute distress, sedated on propofol, intubated  HENT: [place]/AT, old scars on his head, endotracheal tube in place Lungs: CTAB, synchronous with the vent. Triggering inspirations. Cardiovascular: S1 S2, regular rate and rhythm Abdomen: Soft, nontender Extremities: No peripheral edema Neuro: R blown pupil, absent pupillary reflexes and pupils not reacting to light. Intact gag reflex. No response to noxious stimuli.  Derm: Warm, dry  ABG    Component Value Date/Time   PHART 7.481 (H) 08/10/2021 0523   PCO2ART 32.4 08/10/2021 0523   PO2ART 170 (H) 08/10/2021 0523   HCO3 25.0 08/10/2021 0523   TCO2 26 08/10/2021 0523   ACIDBASEDEF 10.0 (H) 08/09/2021 2211   O2SAT 100 08/10/2021 0523    Resolved Hospital Problem list   AKI (resolved)  Assessment & Plan:   C/f Anoxic brain injury EEG reviewed by Neurology and c/w profound diffuse encephalopathy. Early CT Head on 7/11 and repeat on 7/12 with signs of possible early loss of gray-white matter boundary. Neuro following and feels that prognosis is poor given the above and minimal brainstem response. On 7/13 AM exam, patient with new blown R pupil, discussed with Neuro and may need CT Head repeat for possible herniation. Still triggering vent. Increased on Keppra and Depakote and  no longer having ocular twitches or myoclonic jerking. Goals of care conversations ongoing and palliative care saw pt and family yesterday. Possible plan for extubation 7/14. Pt is now DNR; not on comfort care yet. - Keppra and Depakote per Neurology - Continuous EEG - Repeat CT Head per Neuro - Neuro following  Acute respiratory failure Initial hypercapnia resolved and pt on PRVC now with 40% FiO2 and PEEP 5. Certainly concern for inability to protect airway secondary to above.  - ABGs  - Continue PRVC - VAP prevention  protocol - PAD protocol for sedation - Empiric antibiotics for aspiration   AKI (resolved) Most likely pre-renal vs ATN 2/2 poor perfusion with unknown amount of time down from cardiac arrest.  Cr at 1.57 on presentation, now normalized to 1.07 after IVF hydration. - Renally dose meds and avoid nephrotoxic meds - Continue to monitor   Cardiac arrest w/u Presumed related to recent drug use. Performed Echo 7/12 which was a poor exam due to vent and cEEG, however no gross signs of cardiac abnormalities.  -supportive care, head of bed greater than 30 degrees -monitor on tele -monitor & optimize electrolytes -TTM normothermia   Elevated transaminases Likely 2/2 to cardiac arrest.  High risk for shock liver. AST and ALT continuing to slowly downtrend on 7/13 to 61, 47 respectively.  - Monitor - Avoid hepatotoxic medications  Hyperglycemia On presentation was 228. - Check A1c - Sliding scale insulin as needed   Best Practice (right click and "Reselect all SmartList Selections" daily)   Diet/type: tubefeeds DVT prophylaxis: prophylactic heparin  GI prophylaxis: PPI Lines: N/A Foley:  Yes, and it is still needed Code Status:  full code Last date of multidisciplinary goals of care discussion [ ]   Labs   CBC: Recent Labs  Lab 08/09/21 1806 08/09/21 2049 08/09/21 2140 08/09/21 2211 08/10/21 0045 08/10/21 0523 08/10/21 0703  WBC 6.2  --  16.6*  --   --   --  15.5*  NEUTROABS 4.2  --   --   --   --   --   --   HGB 14.7   < > 16.2 16.0 13.9 13.6 15.3  HCT 41.0   < > 46.5 47.0 41.0 40.0 42.7  MCV 89.3  --  89.9  --   --   --  88.8  PLT 272  --  283  --   --   --  196   < > = values in this interval not displayed.     Basic Metabolic Panel: Recent Labs  Lab 08/09/21 1806 08/09/21 2049 08/09/21 2211 08/10/21 0045 08/10/21 0523 08/10/21 0703 08/28/21 0148  NA 138   < > 141 142 142 143 139  K 3.8   < > 4.3 3.8 3.7 4.5 3.8  CL 105  --   --   --   --  106 104  CO2 14*   --   --   --   --  19* 23  GLUCOSE 228*  --   --   --   --  85 134*  BUN 15  --   --   --   --  17 18  CREATININE 1.57*  --   --   --   --  1.26* 1.07  CALCIUM 8.4*  --   --   --   --  8.7* 8.6*  MG  --   --   --   --   --  1.7  --  PHOS  --   --   --   --   --  4.3 2.6   < > = values in this interval not displayed.    GFR: Estimated Creatinine Clearance: 106.5 mL/min (by C-G formula based on SCr of 1.07 mg/dL). Recent Labs  Lab 08/09/21 1806 08/09/21 2140 08/10/21 0703  WBC 6.2 16.6* 15.5*     Liver Function Tests: Recent Labs  Lab 08/09/21 1806 08/10/21 0703 09-04-2021 0148  AST 68* 61* 61*  ALT 70* 62* 47*  ALKPHOS 77 71 76  BILITOT 1.1 1.1 1.0  PROT 6.8 6.4* RESULTS UNAVAILABLE DUE TO INTERFERING SUBSTANCE  ALBUMIN 4.3 3.9 3.7    No results for input(s): "LIPASE", "AMYLASE" in the last 168 hours. No results for input(s): "AMMONIA" in the last 168 hours.  ABG    Component Value Date/Time   PHART 7.481 (H) 08/10/2021 0523   PCO2ART 32.4 08/10/2021 0523   PO2ART 170 (H) 08/10/2021 0523   HCO3 25.0 08/10/2021 0523   TCO2 26 08/10/2021 0523   ACIDBASEDEF 10.0 (H) 08/09/2021 2211   O2SAT 100 08/10/2021 0523     Coagulation Profile: No results for input(s): "INR", "PROTIME" in the last 168 hours.  Cardiac Enzymes: No results for input(s): "CKTOTAL", "CKMB", "CKMBINDEX", "TROPONINI" in the last 168 hours.  HbA1C: Hgb A1c MFr Bld  Date/Time Value Ref Range Status  08/09/2021 09:40 PM 4.8 4.8 - 5.6 % Final    Comment:    (NOTE) Pre diabetes:          5.7%-6.4%  Diabetes:              >6.4%  Glycemic control for   <7.0% adults with diabetes     CBG: Recent Labs  Lab 08/10/21 1146 08/10/21 1556 08/10/21 1938 08/10/21 2332 2021/09/04 0329  GLUCAP 86 86 110* 143* 171*     Review of Systems:   Unable   Past Medical History:  He,  has a past medical history of Abscess, ADHD, IVDU (intravenous drug user), Oppositional defiant disorder, and  Tobacco abuse.   Surgical History:   Past Surgical History:  Procedure Laterality Date   ABCESS DRAINAGE       Social History:   reports that he has been smoking cigarettes. He has a 2.50 pack-year smoking history. He uses smokeless tobacco.   Family History:  His family history includes COPD in his mother; Heart disease in his paternal grandfather and paternal grandmother; Hyperlipidemia in his father.   Allergies No Known Allergies   Home Medications  Prior to Admission medications   Not on File    Signed,  Marcelline Mates, MS4

## 2021-08-11 NOTE — Progress Notes (Signed)
Neurology Progress Note   S:// Patient seen and examined this morning. EEG remained flat on bedside review. No twitching clinically noted Right pupil fixed and dilated 5 mm, left pupil 3 mm fixed and dilated. Continues to have 1 or 2 breaths over the vent and a very weak cough and gag.  O:// Current vital signs: BP (!) 165/101 (BP Location: Left Arm)   Pulse 67   Temp (!) 95.7 F (35.4 C) (Core)   Resp 18   Ht 5\' 10"  (1.778 m)   Wt 72 kg   SpO2 98%   BMI 22.78 kg/m  Vital signs in last 24 hours: Temp:  [78.1 F (25.6 C)-99.7 F (37.6 C)] 95.7 F (35.4 C) (07/13 0800) Pulse Rate:  [60-121] 67 (07/13 0800) Resp:  [7-27] 18 (07/13 0800) BP: (81-165)/(62-101) 165/101 (07/13 0800) SpO2:  [96 %-100 %] 98 % (07/13 0800) FiO2 (%):  [30 %-40 %] 40 % (07/13 0800) Weight:  [72 kg] 72 kg (07/13 0500) General: Sedated intubated HNT: Normocephalic atraumatic CVs: Regular rhythm Abdomen nondistended nontender Neurological exam On sedation which was held for the exam No spontaneous movement Right pupil 5 to 6 mm fixed dilated.  Left pupil 3 mm fixed dilated Coronary flexes absent Weak cough and gag No response to noxious stimulation Absent oculocephalics I was told he has 1 to 2 breaths over the vent at times.  Exam not consistent with brain death yet but clinical signs of downward brain herniation are clearly evident.   Medications  Current Facility-Administered Medications:    0.9 %  sodium chloride infusion, 250 mL, Intravenous, Continuous, 11-22-1968, DO, Last Rate: 10 mL/hr at August 23, 2021 0800, Infusion Verify at 08/23/2021 0800   acetaminophen (TYLENOL) tablet 650 mg, 650 mg, Per Tube, Q4H, 650 mg at 2021-08-23 0515 **OR** acetaminophen (TYLENOL) 160 MG/5ML solution 650 mg, 650 mg, Per Tube, Q4H, 650 mg at 08/10/21 1647 **OR** acetaminophen (TYLENOL) suppository 650 mg, 650 mg, Rectal, Q4H, 10/11/21, DO, 650 mg at 08/09/21 2259   acetaminophen (TYLENOL) tablet 650 mg,  650 mg, Per Tube, Q4H PRN **OR** acetaminophen (TYLENOL) 160 MG/5ML solution 650 mg, 650 mg, Per Tube, Q4H PRN **OR** acetaminophen (TYLENOL) suppository 650 mg, 650 mg, Rectal, Q4H PRN, 2260, Laura P, DO   albuterol (PROVENTIL) (2.5 MG/3ML) 0.083% nebulizer solution 2.5 mg, 2.5 mg, Nebulization, Q2H PRN, Chestine Spore, Laura P, DO   busPIRone (BUSPAR) tablet 30 mg, 30 mg, Per Tube, Q8H PRN **OR** busPIRone (BUSPAR) tablet 30 mg, 30 mg, Per Tube, Q8H PRN, 09-09-1981 P, DO   cefTRIAXone (ROCEPHIN) 2 g in sodium chloride 0.9 % 100 mL IVPB, 2 g, Intravenous, Q24H, Karie Fetch, MD, Last Rate: 200 mL/hr at 08/10/21 2220, 2 g at 08/10/21 2220   Chlorhexidine Gluconate Cloth 2 % PADS 6 each, 6 each, Topical, Daily, 2221, DO, 6 each at 08/10/21 1509   docusate (COLACE) 50 MG/5ML liquid 100 mg, 100 mg, Per Tube, BID PRN, 10/11/21, DO   docusate (COLACE) 50 MG/5ML liquid 100 mg, 100 mg, Per Tube, BID, Steffanie Dunn, DO, 100 mg at 08/10/21 2143   feeding supplement (PROSource TF) liquid 45 mL, 45 mL, Per Tube, BID, 2144, DO, 45 mL at 08/10/21 2144   feeding supplement (VITAL HIGH PROTEIN) liquid 1,000 mL, 1,000 mL, Per Tube, Q24H, 2145, DO, Infusion Verify at 23-Aug-2021 0800   heparin injection 5,000 Units, 5,000 Units, Subcutaneous, Q8H, Clark, Laura P, DO, 5,000 Units  at 09-02-21 0515   insulin aspart (novoLOG) injection 1-3 Units, 1-3 Units, Subcutaneous, Q4H, Steffanie Dunn, DO, 3 Units at 02-Sep-2021 0816   levETIRAcetam (KEPPRA) IVPB 1000 mg/100 mL premix, 1,000 mg, Intravenous, Q12H, Steffanie Dunn, DO, Stopped at 08/10/21 2212   magnesium sulfate IVPB 2 g 50 mL, 2 g, Intravenous, Once PRN, Karie Fetch P, DO   norepinephrine (LEVOPHED) 4mg  in (0.016 mg/mL) premix infusion, 2-10 mcg/min, Intravenous, Titrated, Clark, Laura P, DO   ondansetron (ZOFRAN) injection 4 mg, 4 mg, Intravenous, Q6H PRN, 09-09-1981, DO   Oral care mouth rinse, 15 mL, Mouth Rinse, Q2H, Steffanie Dunn, DO, 15 mL at 09/02/2021 0516   Oral care mouth rinse, 15 mL, Mouth Rinse, PRN, 08/13/21 P, DO   pantoprazole sodium (PROTONIX) 40 mg/20 mL oral suspension 40 mg, 40 mg, Per Tube, Daily, Karie Fetch P, DO, 40 mg at 08/10/21 0930   polyethylene glycol (MIRALAX / GLYCOLAX) packet 17 g, 17 g, Per Tube, Daily PRN, 10/11/21 P, DO   polyethylene glycol (MIRALAX / GLYCOLAX) packet 17 g, 17 g, Per Tube, Daily, Karie Fetch P, DO, 17 g at 08/10/21 0929   propofol (DIPRIVAN) 1000 MG/100ML infusion, 5-120 mcg/kg/min, Intravenous, Continuous, 10/11/21, MD, Stopped at 02-Sep-2021 0755   valproate (DEPACON) 500 mg in dextrose 5 % 50 mL IVPB, 500 mg, Intravenous, Q12H, 08/13/21, MD, Stopped at 08/10/21 2354 Labs CBC    Component Value Date/Time   WBC 15.5 (H) 08/10/2021 0703   RBC 4.81 08/10/2021 0703   HGB 15.3 08/10/2021 0703   HCT 42.7 08/10/2021 0703   PLT 196 08/10/2021 0703   MCV 88.8 08/10/2021 0703   MCH 31.8 08/10/2021 0703   MCHC 35.8 08/10/2021 0703   RDW 13.1 08/10/2021 0703   LYMPHSABS 1.7 08/09/2021 1806   MONOABS 0.2 08/09/2021 1806   EOSABS 0.0 08/09/2021 1806   BASOSABS 0.0 08/09/2021 1806    CMP     Component Value Date/Time   NA 139 02-Sep-2021 0148   K 3.8 09/02/2021 0148   CL 104 September 02, 2021 0148   CO2 23 Sep 02, 2021 0148   GLUCOSE 134 (H) 09-02-21 0148   BUN 18 09-02-2021 0148   CREATININE 1.07 2021-09-02 0148   CALCIUM 8.6 (L) 09/02/2021 0148   PROT RESULTS UNAVAILABLE DUE TO INTERFERING SUBSTANCE 09-02-21 0148   ALBUMIN 3.7 2021/09/02 0148   AST 61 (H) September 02, 2021 0148   ALT 47 (H) September 02, 2021 0148   ALKPHOS 76 09-02-21 0148   BILITOT 1.0 09/02/21 0148   GFRNONAA >60 09-02-21 0148  UDS positive for cocaine, THC  Imaging Stat CT head was ordered-shows findings compatible with diffuse severe anoxic brain injury along with diffuse sulcal effacement, cerebral edema and downward herniation.  Overnight EEG with burst suppression pattern.   Formal read pending.  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 -Post anoxic myoclonus/myoclonic status epilepticus-resolved. -Concern for poor prognosis from a neurological meaningful recovery standpoint given imaging and clinical exam findings which are poor. -Brain herniation  At this time, patient is likely to proceed to brain death in the next few hours today.  Recommendations: -Continue Keppra and Depakote. -Sedation as needed -Current exam is extremely poor although he is not brain dead yet but he is likely to progress to brain death in the next few hours today.  Apnea test can be performed by the ICU team if desired for confirmation once there is loss of the brainstem  reflexes. -Family conversation should be had to update them about this and consider organ donation if patient is an organ donor. Neurology will be available as needed. Please call with questions Discussed with the ICU team-Dr. Katrinka Blazing. -- Milon Dikes, MD Neurologist [facility] Pager: [phone]  CRITICAL CARE ATTESTATION Performed by: Milon Dikes, MD Total critical care time: 34 minutes Critical care time was exclusive of separately billable procedures and treating other patients and/or supervising APPs/Residents/Students Critical care was necessary to treat or prevent imminent or life-threatening deterioration due to anoxic brain injury  This patient is critically ill and at significant risk for neurological worsening and/or death and care requires constant monitoring. Critical care was time spent personally by me on the following activities: development of treatment plan with patient and/or surrogate as well as nursing, discussions with consultants, evaluation of patient's response to treatment, examination of patient, obtaining history from patient or surrogate, ordering and performing treatments and interventions, ordering and review of  laboratory studies, ordering and review of radiographic studies, pulse oximetry, re-evaluation of patient's condition, participation in multidisciplinary rounds and medical decision making of high complexity in the care of this patient.

## 2021-08-11 NOTE — Procedures (Signed)
Patient Name: Rodriquez Thorner  MRN: [id number]  Epilepsy Attending: Charlsie Quest  Referring Physician/Provider: Rejeana Brock, MD  Duration: 08-18-2021 0126 to 2021-08-18 4097   Patient history:  26 year old male status post cardiac arrest.  EEG to evaluate for seizure   Level of alertness:  comatose   AEDs during EEG study: VPA, LEV, Propofol   Technical aspects: This EEG study was done with scalp electrodes positioned according to the 10-20 International system of electrode placement. Electrical activity was acquired at a sampling rate of 500Hz  and reviewed with a high frequency filter of 70Hz  and a low frequency filter of 1Hz . EEG data were recorded continuously and digitally stored.    Description: At the beginning of the study, EEG showed continuous generalized polymorphic sharply contoured 3 to 7 Hz theta and delta slowing.  Gradually, EEG worsened and showed continuous generalized background suppression. Hyperventilation and photic stimulation were not performed.      ABNORMALITY -Continuous slow, generalized -Background suppression, generalized  IMPRESSION: This study was initially suggestive of severe diffuse encephalopathy.  Gradually, EEG was and was suggestive of profound diffuse encephalopathy.  No definite seizures were seen during the study.  In the setting of cardiac arrest, this is most likely suggestive of anoxic/hypoxic brain injury.   Graysin Luczynski 

## 2021-08-11 NOTE — Progress Notes (Signed)
LTM EEG discontinued - no skin breakdown at unhook.   

## 2021-08-11 NOTE — Progress Notes (Signed)
Wasted of morphine in the stericycle with Eddie Candle, RN

## 2021-08-12 ENCOUNTER — Encounter: Payer: Self-pay | Admitting: Critical Care Medicine

## 2021-08-14 LAB — CULTURE, BLOOD (ROUTINE X 2)
Culture: NO GROWTH
Culture: NO GROWTH
Special Requests: ADEQUATE

## 2021-08-16 LAB — MISC LABCORP TEST (SEND OUT): Labcorp test code: 9985

## 2021-08-30 NOTE — Death Summary Note (Signed)
  Name: Frank Duran MRN: [id number] DOB: [birth year] 26 y.o.  Date of Admission: 08/09/2021  5:58 PM Date of Discharge: 09-04-21 Attending Physician: Lorin Glass, MD  Discharge Diagnosis: Principal Problem:   Cardiac arrest [facility]) Anoxic brain injury  Cause of death: Cardiac arrest Time of death: May 30, 1307  Disposition and follow-up:   Mr.[name] was discharged from [facility] in expired condition.    Hospital Course: [place]. [place] presented to [facility] ED after suffering a witness cardiac arrest. On arrival to ED he an ETT was placed and patient was admitted to ICU for post-arrest care. Initial labs were remarkable for acute kidney injury, elevated transaminases, elevated troponin, and UDS positive for cocaine, marijuana. Based on exam findings, EEG, and CT head showing signs of possible early loss of grey-white matter boundary, there was concern for anoxic brain injury. He received antibiotics for aspiration pneumonia and propofol for sedation and myoclonus. He was made DNR on HD 1. On HD 2 he did show signs of improvement of kidney, liver, and cardiac derangements, however neurological exam further declined and repeat CT head showed findings compatible with diffuse severe anoxic brain injury with diffuse sulcal effacement and cerebral edema with downward herniation. Patient was transitioned to comfort care and expired at 13:09.  Signed: Champ Mungo, DO 2021/09/04, 1:20 PM

## 2021-08-30 DEATH — deceased
